# Patient Record
Sex: Female | Born: 1980 | Race: Black or African American | Hispanic: No | Marital: Married | Smoking: Never smoker
Health system: Southern US, Community
[De-identification: ages and names within clinical notes are randomized; demographics above are authoritative.]

## PROBLEM LIST (undated history)

## (undated) DIAGNOSIS — D219 Benign neoplasm of connective and other soft tissue, unspecified: Secondary | ICD-10-CM

## (undated) HISTORY — DX: Benign neoplasm of connective and other soft tissue, unspecified: D21.9

## (undated) HISTORY — PX: NO PAST SURGERIES: SHX2092

---

## 2018-05-21 ENCOUNTER — Emergency Department (HOSPITAL_COMMUNITY)
Admission: EM | Admit: 2018-05-21 | Discharge: 2018-05-21 | Disposition: A | Discharge status: Home / Self Care | Payer: Self-pay | Attending: Emergency Medicine | Admitting: Emergency Medicine

## 2018-05-21 ENCOUNTER — Encounter (HOSPITAL_COMMUNITY): Payer: Self-pay

## 2018-05-21 DIAGNOSIS — S39012A Strain of muscle, fascia and tendon of lower back, initial encounter: Secondary | ICD-10-CM

## 2018-05-21 DIAGNOSIS — Y92410 Unspecified street and highway as the place of occurrence of the external cause: Secondary | ICD-10-CM | POA: Insufficient documentation

## 2018-05-21 DIAGNOSIS — Y939 Activity, unspecified: Secondary | ICD-10-CM | POA: Insufficient documentation

## 2018-05-21 DIAGNOSIS — Y998 Other external cause status: Secondary | ICD-10-CM | POA: Insufficient documentation

## 2018-05-21 MED ORDER — NAPROXEN 500 MG PO TABS: 500.0000 mg | ORAL_TABLET | Freq: Two times a day (BID) | ORAL | 0 refills | Status: DC | PRN

## 2018-05-21 MED ORDER — METHOCARBAMOL 500 MG PO TABS: 500.0000 mg | ORAL_TABLET | Freq: Two times a day (BID) | ORAL | 0 refills | Status: DC | PRN

## 2018-05-21 NOTE — ED Notes (Signed)
ED Provider at bedside. 

## 2018-05-21 NOTE — ED Notes (Signed)
Patient verbalizes understanding of discharge instructions. Opportunity for questioning and answers were provided. Armband removed by staff, pt discharged from ED. Pt ambulatory to lobby.  
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
## 2018-05-21 NOTE — Discharge Instructions (Signed)
Alternate ice and heat to areas of injury 3-4 times per day to limit inflammation and spasm.  Avoid strenuous activity and heavy lifting.  We recommend consistent use of naproxen in addition to Robaxin for muscle spasms. We recommend follow-up with a primary care doctor to ensure resolution of symptoms.  Return to the ED for any new or concerning symptoms. 
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
## 2019-12-20 ENCOUNTER — Other Ambulatory Visit: Payer: Self-pay | Admitting: Obstetrics & Gynecology

## 2019-12-20 DIAGNOSIS — D259 Leiomyoma of uterus, unspecified: Secondary | ICD-10-CM

## 2019-12-20 DIAGNOSIS — Z3A15 15 weeks gestation of pregnancy: Secondary | ICD-10-CM

## 2019-12-20 DIAGNOSIS — O4402 Placenta previa specified as without hemorrhage, second trimester: Secondary | ICD-10-CM

## 2019-12-20 DIAGNOSIS — O26879 Cervical shortening, unspecified trimester: Secondary | ICD-10-CM

## 2019-12-25 ENCOUNTER — Encounter: Payer: Self-pay | Admitting: *Deleted

## 2019-12-26 ENCOUNTER — Other Ambulatory Visit: Payer: Self-pay | Admitting: Obstetrics & Gynecology

## 2019-12-26 ENCOUNTER — Ambulatory Visit: Payer: 59

## 2019-12-26 ENCOUNTER — Other Ambulatory Visit: Payer: Self-pay

## 2019-12-26 ENCOUNTER — Ambulatory Visit: Payer: 59 | Admitting: *Deleted

## 2019-12-26 ENCOUNTER — Ambulatory Visit: Payer: 59 | Attending: Obstetrics & Gynecology

## 2019-12-26 VITALS — BP 116/77 | HR 82

## 2019-12-26 DIAGNOSIS — O09522 Supervision of elderly multigravida, second trimester: Secondary | ICD-10-CM | POA: Diagnosis not present

## 2019-12-26 DIAGNOSIS — O26879 Cervical shortening, unspecified trimester: Secondary | ICD-10-CM

## 2019-12-26 DIAGNOSIS — O09519 Supervision of elderly primigravida, unspecified trimester: Secondary | ICD-10-CM

## 2019-12-26 DIAGNOSIS — Z3A15 15 weeks gestation of pregnancy: Secondary | ICD-10-CM

## 2019-12-26 DIAGNOSIS — D259 Leiomyoma of uterus, unspecified: Secondary | ICD-10-CM

## 2019-12-26 DIAGNOSIS — O3412 Maternal care for benign tumor of corpus uteri, second trimester: Secondary | ICD-10-CM

## 2019-12-26 DIAGNOSIS — O4402 Placenta previa specified as without hemorrhage, second trimester: Secondary | ICD-10-CM | POA: Insufficient documentation

## 2019-12-26 DIAGNOSIS — O359XX0 Maternal care for (suspected) fetal abnormality and damage, unspecified, not applicable or unspecified: Secondary | ICD-10-CM | POA: Diagnosis not present

## 2019-12-26 DIAGNOSIS — O09512 Supervision of elderly primigravida, second trimester: Secondary | ICD-10-CM | POA: Insufficient documentation

## 2019-12-26 DIAGNOSIS — O26872 Cervical shortening, second trimester: Secondary | ICD-10-CM | POA: Diagnosis present

## 2019-12-27 ENCOUNTER — Other Ambulatory Visit: Payer: Self-pay | Admitting: *Deleted

## 2019-12-27 DIAGNOSIS — O26879 Cervical shortening, unspecified trimester: Secondary | ICD-10-CM

## 2020-01-09 ENCOUNTER — Ambulatory Visit: Payer: 59

## 2020-01-09 ENCOUNTER — Other Ambulatory Visit: Payer: Self-pay

## 2020-01-10 ENCOUNTER — Ambulatory Visit: Payer: 59 | Admitting: *Deleted

## 2020-01-10 ENCOUNTER — Ambulatory Visit: Payer: 59 | Attending: Maternal & Fetal Medicine

## 2020-01-10 VITALS — BP 117/79 | HR 77

## 2020-01-10 DIAGNOSIS — O09519 Supervision of elderly primigravida, unspecified trimester: Secondary | ICD-10-CM | POA: Insufficient documentation

## 2020-01-10 DIAGNOSIS — O3432 Maternal care for cervical incompetence, second trimester: Secondary | ICD-10-CM

## 2020-01-10 DIAGNOSIS — O3412 Maternal care for benign tumor of corpus uteri, second trimester: Secondary | ICD-10-CM

## 2020-01-10 DIAGNOSIS — O26872 Cervical shortening, second trimester: Secondary | ICD-10-CM | POA: Diagnosis not present

## 2020-01-10 DIAGNOSIS — O26879 Cervical shortening, unspecified trimester: Secondary | ICD-10-CM | POA: Diagnosis present

## 2020-01-10 DIAGNOSIS — O4422 Partial placenta previa NOS or without hemorrhage, second trimester: Secondary | ICD-10-CM | POA: Diagnosis not present

## 2020-01-10 DIAGNOSIS — D259 Leiomyoma of uterus, unspecified: Secondary | ICD-10-CM

## 2020-01-10 DIAGNOSIS — O09522 Supervision of elderly multigravida, second trimester: Secondary | ICD-10-CM

## 2020-01-10 DIAGNOSIS — Z3686 Encounter for antenatal screening for cervical length: Secondary | ICD-10-CM

## 2020-01-10 DIAGNOSIS — O321XX0 Maternal care for breech presentation, not applicable or unspecified: Secondary | ICD-10-CM

## 2020-01-10 DIAGNOSIS — Z3A17 17 weeks gestation of pregnancy: Secondary | ICD-10-CM

## 2020-01-11 ENCOUNTER — Other Ambulatory Visit: Payer: Self-pay | Admitting: *Deleted

## 2020-01-11 DIAGNOSIS — O26879 Cervical shortening, unspecified trimester: Secondary | ICD-10-CM

## 2020-01-17 ENCOUNTER — Other Ambulatory Visit: Payer: Self-pay | Admitting: Obstetrics and Gynecology

## 2020-01-17 ENCOUNTER — Other Ambulatory Visit: Payer: Self-pay | Admitting: Obstetrics & Gynecology

## 2020-01-24 ENCOUNTER — Ambulatory Visit: Payer: 59 | Attending: Obstetrics and Gynecology

## 2020-01-24 ENCOUNTER — Ambulatory Visit: Payer: 59 | Admitting: *Deleted

## 2020-01-24 ENCOUNTER — Other Ambulatory Visit: Payer: Self-pay

## 2020-01-24 ENCOUNTER — Other Ambulatory Visit: Payer: Self-pay | Admitting: *Deleted

## 2020-01-24 VITALS — BP 143/67 | HR 83

## 2020-01-24 DIAGNOSIS — O09519 Supervision of elderly primigravida, unspecified trimester: Secondary | ICD-10-CM | POA: Diagnosis present

## 2020-01-24 DIAGNOSIS — Z362 Encounter for other antenatal screening follow-up: Secondary | ICD-10-CM

## 2020-01-24 DIAGNOSIS — Z3686 Encounter for antenatal screening for cervical length: Secondary | ICD-10-CM

## 2020-01-24 DIAGNOSIS — O26879 Cervical shortening, unspecified trimester: Secondary | ICD-10-CM | POA: Diagnosis not present

## 2020-01-24 DIAGNOSIS — Z3A19 19 weeks gestation of pregnancy: Secondary | ICD-10-CM

## 2020-01-24 DIAGNOSIS — O4402 Placenta previa specified as without hemorrhage, second trimester: Secondary | ICD-10-CM | POA: Diagnosis not present

## 2020-01-24 DIAGNOSIS — O3412 Maternal care for benign tumor of corpus uteri, second trimester: Secondary | ICD-10-CM

## 2020-01-24 DIAGNOSIS — O359XX0 Maternal care for (suspected) fetal abnormality and damage, unspecified, not applicable or unspecified: Secondary | ICD-10-CM

## 2020-01-24 DIAGNOSIS — O43129 Velamentous insertion of umbilical cord, unspecified trimester: Secondary | ICD-10-CM

## 2020-01-24 DIAGNOSIS — O09522 Supervision of elderly multigravida, second trimester: Secondary | ICD-10-CM | POA: Diagnosis not present

## 2020-01-24 DIAGNOSIS — D259 Leiomyoma of uterus, unspecified: Secondary | ICD-10-CM

## 2020-01-30 ENCOUNTER — Ambulatory Visit: Payer: 59

## 2020-01-30 ENCOUNTER — Ambulatory Visit: Payer: 59 | Attending: Maternal & Fetal Medicine

## 2020-01-30 ENCOUNTER — Other Ambulatory Visit: Payer: Self-pay

## 2020-01-30 ENCOUNTER — Other Ambulatory Visit: Payer: Self-pay | Admitting: *Deleted

## 2020-01-30 ENCOUNTER — Other Ambulatory Visit: Payer: Self-pay | Admitting: Maternal & Fetal Medicine

## 2020-01-30 DIAGNOSIS — O359XX0 Maternal care for (suspected) fetal abnormality and damage, unspecified, not applicable or unspecified: Secondary | ICD-10-CM

## 2020-01-30 DIAGNOSIS — O26879 Cervical shortening, unspecified trimester: Secondary | ICD-10-CM

## 2020-01-30 DIAGNOSIS — O4402 Placenta previa specified as without hemorrhage, second trimester: Secondary | ICD-10-CM | POA: Diagnosis not present

## 2020-01-30 DIAGNOSIS — Z3A20 20 weeks gestation of pregnancy: Secondary | ICD-10-CM

## 2020-01-30 DIAGNOSIS — O3412 Maternal care for benign tumor of corpus uteri, second trimester: Secondary | ICD-10-CM

## 2020-01-30 DIAGNOSIS — D259 Leiomyoma of uterus, unspecified: Secondary | ICD-10-CM

## 2020-01-30 DIAGNOSIS — O09522 Supervision of elderly multigravida, second trimester: Secondary | ICD-10-CM | POA: Diagnosis not present

## 2020-01-30 DIAGNOSIS — O43122 Velamentous insertion of umbilical cord, second trimester: Secondary | ICD-10-CM

## 2020-01-30 DIAGNOSIS — Z3686 Encounter for antenatal screening for cervical length: Secondary | ICD-10-CM

## 2020-02-07 ENCOUNTER — Other Ambulatory Visit: Payer: Self-pay

## 2020-02-07 ENCOUNTER — Ambulatory Visit: Payer: 59

## 2020-02-07 ENCOUNTER — Inpatient Hospital Stay (HOSPITAL_COMMUNITY)
Admission: AD | Admit: 2020-02-07 | Discharge: 2020-02-07 | Disposition: A | Discharge status: Home / Self Care | Payer: 59 | Attending: Obstetrics and Gynecology | Admitting: Obstetrics and Gynecology

## 2020-02-07 ENCOUNTER — Other Ambulatory Visit: Payer: 59

## 2020-02-07 ENCOUNTER — Encounter (HOSPITAL_COMMUNITY): Payer: Self-pay | Admitting: Obstetrics and Gynecology

## 2020-02-07 DIAGNOSIS — R82998 Other abnormal findings in urine: Secondary | ICD-10-CM | POA: Diagnosis not present

## 2020-02-07 DIAGNOSIS — O4402 Placenta previa specified as without hemorrhage, second trimester: Secondary | ICD-10-CM | POA: Insufficient documentation

## 2020-02-07 DIAGNOSIS — R1032 Left lower quadrant pain: Secondary | ICD-10-CM | POA: Diagnosis present

## 2020-02-07 DIAGNOSIS — O26892 Other specified pregnancy related conditions, second trimester: Secondary | ICD-10-CM | POA: Diagnosis not present

## 2020-02-07 DIAGNOSIS — O99891 Other specified diseases and conditions complicating pregnancy: Secondary | ICD-10-CM | POA: Diagnosis not present

## 2020-02-07 DIAGNOSIS — O26879 Cervical shortening, unspecified trimester: Secondary | ICD-10-CM | POA: Diagnosis present

## 2020-02-07 DIAGNOSIS — O43129 Velamentous insertion of umbilical cord, unspecified trimester: Secondary | ICD-10-CM | POA: Diagnosis present

## 2020-02-07 DIAGNOSIS — O4400 Placenta previa specified as without hemorrhage, unspecified trimester: Secondary | ICD-10-CM | POA: Diagnosis present

## 2020-02-07 DIAGNOSIS — O09512 Supervision of elderly primigravida, second trimester: Secondary | ICD-10-CM | POA: Insufficient documentation

## 2020-02-07 DIAGNOSIS — Z3A21 21 weeks gestation of pregnancy: Secondary | ICD-10-CM | POA: Insufficient documentation

## 2020-02-07 DIAGNOSIS — O26872 Cervical shortening, second trimester: Secondary | ICD-10-CM

## 2020-02-07 LAB — URINALYSIS, ROUTINE W REFLEX MICROSCOPIC
Bilirubin Urine: NEGATIVE
Glucose, UA: NEGATIVE mg/dL
Hgb urine dipstick: NEGATIVE
Ketones, ur: NEGATIVE mg/dL
Nitrite: NEGATIVE
Protein, ur: NEGATIVE mg/dL
Specific Gravity, Urine: 1.025 (ref 1.005–1.030)
pH: 5 (ref 5.0–8.0)

## 2020-02-07 MED ORDER — CEPHALEXIN 500 MG PO CAPS: 500.0000 mg | ORAL_CAPSULE | Freq: Four times a day (QID) | ORAL | 2 refills | Status: DC

## 2020-02-07 MED ORDER — IBUPROFEN 600 MG PO TABS
600.0000 mg | ORAL_TABLET | Freq: Once | ORAL | Status: AC
  Administered 2020-02-07: 600 mg via ORAL
  Filled 2020-02-07: qty 1

## 2020-02-07 NOTE — MAU Note (Addendum)
I have had pain in my abdomen since Tues. Was alittle better yesterday. THis morning I was crying with pain. Esp hurt to turn in bed and get up. Pain worse in LLQ and is sharp. Last BM Weds and was normal. Denies VB or LOF. I called the office yesterday and someone took my information but never received a call back.

## 2020-02-07 NOTE — MAU Provider Note (Signed)
Chief Complaint:  Abdominal Pain   First Provider Initiated Contact with Patient 02/07/20 0531     HPI: Tiffany Hoffman is a 39 y.o. G1P0 at 38w5dwho presents to maternity admissions reporting pain in the left lower quadrant since Tuesday. Tried calling the office but no one called her back so she came in. States has had normal bowel movements and denies dysuria.  Hurts more with turning over in bed or getting up.  . She reports good fetal movement, denies LOF, vaginal bleeding, vaginal itching/burning, urinary symptoms, h/a, dizziness, n/v, diarrhea, constipation or fever/chills.  She denies headache, visual changes or RUQ abdominal pain.  Abdominal Pain This is a new problem. The current episode started in the past 7 days. The problem occurs intermittently. The problem has been unchanged. The pain is located in the LLQ. The pain is moderate. The quality of the pain is cramping and sharp. The abdominal pain does not radiate. Pertinent negatives include no anorexia, constipation, diarrhea, dysuria, fever, frequency, headaches, myalgias, nausea or vomiting. The pain is aggravated by certain positions and movement. The pain is relieved by nothing. She has tried nothing for the symptoms.    RN Note: have had pain in my abdomen since Tues. Was alittle better yesterday. THis morning I was crying with pain. Esp hurt to turn in bed and get up. Pain worse in LLQ and is sharp. Last BM Weds and was normal. Denies VB or LOF. I called the office yesterday and someone took my information but never received a call back.  Past Medical History: Past Medical History:  Diagnosis Date  . Fibroid     Past obstetric history: OB History  Gravida Para Term Preterm AB Living  1         0  SAB TAB Ectopic Multiple Live Births               # Outcome Date GA Lbr Len/2nd Weight Sex Delivery Anes PTL Lv  1 Current             Past Surgical History: Past Surgical History:  Procedure Laterality Date  . NO PAST  SURGERIES      Family History: Family History  Problem Relation Age of Onset  . Heart disease Mother   . Hypertension Father     Social History: Social History   Tobacco Use  . Smoking status: Never Smoker  . Smokeless tobacco: Never Used  Vaping Use  . Vaping Use: Never used  Substance Use Topics  . Alcohol use: Never  . Drug use: Not Currently    Allergies:  Allergies  Allergen Reactions  . Latex Itching    Meds:  Medications Prior to Admission  Medication Sig Dispense Refill Last Dose  . ondansetron (ZOFRAN-ODT) 4 MG disintegrating tablet Take 4 mg by mouth every 8 (eight) hours as needed for nausea or vomiting.   Past Week at Unknown time  . Prenatal Vit-Fe Fumarate-FA (PRENATAL VITAMINS PO) Take by mouth.   02/06/2020 at Unknown time  . progesterone 200 MG SUPP Place 200 mg vaginally at bedtime.   02/06/2020 at Unknown time  . Doxylamine Succinate, Sleep, (UNISOM PO) Take by mouth. (Patient not taking: Reported on 12/26/2019)     . Pyridoxine HCl (VITAMIN B-6 PO) Take by mouth. (Patient not taking: Reported on 12/26/2019)       I have reviewed patient's Past Medical Hx, Surgical Hx, Family Hx, Social Hx, medications and allergies.   ROS:  Review of Systems  Constitutional:  Negative for fever.  Gastrointestinal: Positive for abdominal pain. Negative for anorexia, constipation, diarrhea, nausea and vomiting.  Genitourinary: Negative for dysuria and frequency.  Musculoskeletal: Negative for myalgias.  Neurological: Negative for headaches.   Other systems negative  Physical Exam   Patient Vitals for the past 24 hrs:  BP Temp Pulse Resp Height Weight  02/07/20 0510 121/76 98.4 F (36.9 C) 79 20 5' 5.5" (1.664 m) 88.9 kg   Constitutional: Well-developed, well-nourished female in no acute distress.  Cardiovascular: normal rate and rhythm Respiratory: normal effort, clear to auscultation bilaterally GI: Abd soft, tender over LLQ, abdomen gravid appropriate for  gestational age.   No rebound or guarding. MS: Extremities nontender, no edema, normal ROM Neurologic: Alert and oriented x 4.  GU: Neg CVAT.  PELVIC EXAM: Gentle, careful exam of her cervix, being aware of her placenta previa.  Cervix is closed and about 50% effaced which is consistent with her [place] measurements of 2cm.    FHT:   152   No irritability seen on Toco   Labs: Results for orders placed or performed during the hospital encounter of 02/07/20 (from the past 24 hour(s))  Urinalysis, Routine w reflex microscopic Urine, Clean Catch     Status: Abnormal   Collection Time: 02/07/20  5:50 AM  Result Value Ref Range   Color, Urine YELLOW YELLOW   APPearance CLOUDY (A) CLEAR   Specific Gravity, Urine 1.025 1.005 - 1.030   pH 5.0 5.0 - 8.0   Glucose, UA NEGATIVE NEGATIVE mg/dL   Hgb urine dipstick NEGATIVE NEGATIVE   Bilirubin Urine NEGATIVE NEGATIVE   Ketones, ur NEGATIVE NEGATIVE mg/dL   Protein, ur NEGATIVE NEGATIVE mg/dL   Nitrite NEGATIVE NEGATIVE   Leukocytes,Ua LARGE (A) NEGATIVE   RBC / HPF 0-5 0 - 5 RBC/hpf   WBC, UA 0-5 0 - 5 WBC/hpf   Bacteria, UA RARE (A) NONE SEEN   Squamous Epithelial / LPF 6-10 0 - 5   Mucus PRESENT      Imaging:    MAU Course/MDM: I have ordered labs and reviewed results. Urine is suggestive of possible infection, given leukocytes.  Will send to culture and start her on Keflex for possible UTI We gave her a dose of ibuprofen 600mg  to see if pain is relieved (which would suggest uterine irritability), but she got little relief of pain with this.  Discussed the cervix being unchanged is reassuring that this is not labor.  WIth leukocytes, it may reflect an infection.  It is also possible that this is round ligament pain    Assessment: Single IUP at [redacted]w[redacted]d Left lower quadrant pain Leukocytes in urine Possible round ligament pain vs UTI  Plan: Discharge home Urine culture Rx Keflex for presumed UTI Message left at office to check in with  her before weekend Preterm Labor precautions and fetal kick counts Pelvic rest Follow up in Office for prenatal visits  Has MFM appt next week (serial [place] measurements)  Pt stable at time of discharge.  Hansel Feinstein CNM, MSN Certified Nurse-Midwife 02/07/2020 5:31 AM

## 2020-02-07 NOTE — Progress Notes (Signed)
Written and verbal d/c instructions given and understanding voiced. PT also given sheet of 'Safe Meds in [place]'

## 2020-02-07 NOTE — Discharge Instructions (Signed)
Pregnancy and Urinary Tract Infection ° °A urinary tract infection (UTI) is an infection of any part of the urinary tract. This includes the kidneys, the tubes that connect your kidneys to your bladder (ureters), the bladder, and the tube that carries urine out of your body (urethra). These organs make, store, and get rid of urine in the body. Your health care provider may use other names to describe the infection. An upper UTI affects the ureters and kidneys (pyelonephritis). A lower UTI affects the bladder (cystitis) and urethra (urethritis). °Most urinary tract infections are caused by bacteria in your genital area, around the entrance to your urinary tract (urethra). These bacteria grow and cause irritation and inflammation of your urinary tract. You are more likely to develop a UTI during pregnancy because the physical and hormonal changes your body goes through can make it easier for bacteria to get into your urinary tract. Your growing baby also puts pressure on your bladder and can affect urine flow. It is important to recognize and treat UTIs in pregnancy because of the risk of serious complications for both you and your baby. °How does this affect me? °Symptoms of a UTI include: °· Needing to urinate right away (urgently). °· Frequent urination or passing small amounts of urine frequently. °· Pain or burning with urination. °· Blood in the urine. °· Urine that smells bad or unusual. °· Trouble urinating. °· Cloudy urine. °· Pain in the abdomen or lower back. °· Vaginal discharge. °You may also have: °· Vomiting or a decreased appetite. °· Confusion. °· Irritability or tiredness. °· A fever. °· Diarrhea. °How does this affect my baby? °An untreated UTI during pregnancy could lead to a kidney infection or a systemic infection, which can cause health problems that could affect your baby. Possible complications of an untreated UTI include: °· Giving birth to your baby before 37 weeks of pregnancy  (premature). °· Having a baby with a low birth weight. °· Developing high blood pressure during pregnancy (preeclampsia). °· Having a low hemoglobin level (anemia). °What can I do to lower my risk? °To prevent a UTI: °· Go to the bathroom as soon as you feel the need. Do not hold urine for long periods of time. °· Always wipe from front to back, especially after a bowel movement. Use each tissue one time when you wipe. °· Empty your bladder after sex. °· Keep your genital area dry. °· Drink 6-10 glasses of water each day. °· Do not douche or use deodorant sprays. °How is this treated? °Treatment for this condition may include: °· Antibiotic medicines that are safe to take during pregnancy. °· Other medicines to treat less common causes of UTI. °Follow these instructions at home: °· If you were prescribed an antibiotic medicine, take it as told by your health care provider. Do not stop using the antibiotic even if you start to feel better. °· Keep all follow-up visits as told by your health care provider. This is important. °Contact a health care provider if: °· Your symptoms do not improve or they get worse. °· You have abnormal vaginal discharge. °Get help right away if you: °· Have a fever. °· Have nausea and vomiting. °· Have back or side pain. °· Feel contractions in your uterus. °· Have lower belly pain. °· Have a gush of fluid from your vagina. °· Have blood in your urine. °Summary °· A urinary tract infection (UTI) is an infection of any part of the urinary tract, which includes the   kidneys, ureters, bladder, and urethra.  Most urinary tract infections are caused by bacteria in your genital area, around the entrance to your urinary tract (urethra).  You are more likely to develop a UTI during pregnancy.  If you were prescribed an antibiotic medicine, take it as told by your health care provider. Do not stop using the antibiotic even if you start to feel better. This information is not intended to  replace advice given to you by your health care provider. Make sure you discuss any questions you have with your health care provider. Document Revised: 08/11/2018 Document Reviewed: 03/23/2018 Elsevier Patient Education  [place]. Abdominal Pain During Pregnancy  Abdominal pain is common during pregnancy, and has many possible causes. Some causes are more serious than others, and sometimes the cause is not known. Abdominal pain can be a sign that labor is starting. It can also be caused by normal growth and stretching of muscles and ligaments during pregnancy. Always tell your health care provider if you have any abdominal pain. Follow these instructions at home:  Do not have sex or put anything in your vagina until your pain goes away completely.  Get plenty of rest until your pain improves.  Drink enough fluid to keep your urine pale yellow.  Take over-the-counter and prescription medicines only as told by your health care provider.  Keep all follow-up visits as told by your health care provider. This is important. Contact a health care provider if:  Your pain continues or gets worse after resting.  You have lower abdominal pain that: ? Comes and goes at regular intervals. ? Spreads to your back. ? Is similar to menstrual cramps.  You have pain or burning when you urinate. Get help right away if:  You have a fever or chills.  You have vaginal bleeding.  You are leaking fluid from your vagina.  You are passing tissue from your vagina.  You have vomiting or diarrhea that lasts for more than 24 hours.  Your baby is moving less than usual.  You feel very weak or faint.  You have shortness of breath.  You develop severe pain in your upper abdomen. Summary  Abdominal pain is common during pregnancy, and has many possible causes.  If you experience abdominal pain during pregnancy, tell your health care provider right away.  Follow your health care provider's  home care instructions and keep all follow-up visits as directed. This information is not intended to replace advice given to you by your health care provider. Make sure you discuss any questions you have with your health care provider. Document Revised: 08/07/2018 Document Reviewed: 07/22/2016 Elsevier Patient Education  [place].

## 2020-02-08 LAB — CULTURE, OB URINE

## 2020-02-11 ENCOUNTER — Inpatient Hospital Stay (HOSPITAL_COMMUNITY)
Admission: AD | Admit: 2020-02-11 | Discharge: 2020-02-11 | Disposition: A | Discharge status: Home / Self Care | Payer: 59 | Attending: Obstetrics and Gynecology | Admitting: Obstetrics and Gynecology

## 2020-02-11 ENCOUNTER — Inpatient Hospital Stay (HOSPITAL_BASED_OUTPATIENT_CLINIC_OR_DEPARTMENT_OTHER): Payer: 59

## 2020-02-11 ENCOUNTER — Other Ambulatory Visit: Payer: Self-pay

## 2020-02-11 ENCOUNTER — Encounter (HOSPITAL_COMMUNITY): Payer: Self-pay | Admitting: Obstetrics and Gynecology

## 2020-02-11 ENCOUNTER — Inpatient Hospital Stay (HOSPITAL_COMMUNITY): Payer: 59

## 2020-02-11 DIAGNOSIS — O4412 Placenta previa with hemorrhage, second trimester: Secondary | ICD-10-CM | POA: Diagnosis not present

## 2020-02-11 DIAGNOSIS — O09522 Supervision of elderly multigravida, second trimester: Secondary | ICD-10-CM

## 2020-02-11 DIAGNOSIS — O26872 Cervical shortening, second trimester: Secondary | ICD-10-CM | POA: Diagnosis not present

## 2020-02-11 DIAGNOSIS — Z3A22 22 weeks gestation of pregnancy: Secondary | ICD-10-CM | POA: Insufficient documentation

## 2020-02-11 DIAGNOSIS — O26892 Other specified pregnancy related conditions, second trimester: Secondary | ICD-10-CM

## 2020-02-11 DIAGNOSIS — O359XX0 Maternal care for (suspected) fetal abnormality and damage, unspecified, not applicable or unspecified: Secondary | ICD-10-CM

## 2020-02-11 DIAGNOSIS — O43122 Velamentous insertion of umbilical cord, second trimester: Secondary | ICD-10-CM

## 2020-02-11 DIAGNOSIS — O3412 Maternal care for benign tumor of corpus uteri, second trimester: Secondary | ICD-10-CM | POA: Diagnosis not present

## 2020-02-11 DIAGNOSIS — O4402 Placenta previa specified as without hemorrhage, second trimester: Secondary | ICD-10-CM

## 2020-02-11 DIAGNOSIS — R102 Pelvic and perineal pain: Secondary | ICD-10-CM | POA: Diagnosis not present

## 2020-02-11 DIAGNOSIS — D259 Leiomyoma of uterus, unspecified: Secondary | ICD-10-CM

## 2020-02-11 LAB — CBC
HCT: 40 % (ref 36.0–46.0)
Hemoglobin: 13.1 g/dL (ref 12.0–15.0)
MCH: 28.2 pg (ref 26.0–34.0)
MCHC: 32.8 g/dL (ref 30.0–36.0)
MCV: 86 fL (ref 80.0–100.0)
Platelets: 217 10*3/uL (ref 150–400)
RBC: 4.65 MIL/uL (ref 3.87–5.11)
RDW: 19.9 % — ABNORMAL HIGH (ref 11.5–15.5)
WBC: 10.5 10*3/uL (ref 4.0–10.5)
nRBC: 0 % (ref 0.0–0.2)

## 2020-02-11 LAB — URINALYSIS, MICROSCOPIC (REFLEX)

## 2020-02-11 LAB — COMPREHENSIVE METABOLIC PANEL
ALT: 15 U/L (ref 0–44)
AST: 17 U/L (ref 15–41)
Albumin: 3.4 g/dL — ABNORMAL LOW (ref 3.5–5.0)
Alkaline Phosphatase: 48 U/L (ref 38–126)
Anion gap: 12 (ref 5–15)
BUN: 12 mg/dL (ref 6–20)
CO2: 20 mmol/L — ABNORMAL LOW (ref 22–32)
Calcium: 9.7 mg/dL (ref 8.9–10.3)
Chloride: 103 mmol/L (ref 98–111)
Creatinine, Ser: 0.66 mg/dL (ref 0.44–1.00)
GFR, Estimated: >60 mL/min (ref >60)
Glucose, Bld: 102 mg/dL — ABNORMAL HIGH (ref 70–99)
Potassium: 3.8 mmol/L (ref 3.5–5.1)
Sodium: 135 mmol/L (ref 135–145)
Total Bilirubin: 0.5 mg/dL (ref 0.3–1.2)
Total Protein: 7.3 g/dL (ref 6.5–8.1)

## 2020-02-11 LAB — URINALYSIS, ROUTINE W REFLEX MICROSCOPIC
Bilirubin Urine: NEGATIVE
Glucose, UA: NEGATIVE mg/dL
Ketones, ur: NEGATIVE mg/dL
Nitrite: NEGATIVE
Protein, ur: NEGATIVE mg/dL
Specific Gravity, Urine: 1.025 (ref 1.005–1.030)
pH: 5.5 (ref 5.0–8.0)

## 2020-02-11 MED ORDER — LACTATED RINGERS IV SOLN: INTRAVENOUS | Status: DC

## 2020-02-11 MED ORDER — HYDROMORPHONE HCL 1 MG/ML IJ SOLN
1.0000 mg | Freq: Once | INTRAMUSCULAR | Status: AC
  Administered 2020-02-11: 1 mg via INTRAVENOUS
  Filled 2020-02-11: qty 1

## 2020-02-11 MED ORDER — LORAZEPAM 2 MG/ML IJ SOLN
0.5000 mg | Freq: Once | INTRAMUSCULAR | Status: AC
  Administered 2020-02-11: 0.5 mg via INTRAVENOUS
  Filled 2020-02-11: qty 1

## 2020-02-11 MED ORDER — HYDROMORPHONE HCL 1 MG/ML IJ SOLN
0.5000 mg | Freq: Once | INTRAMUSCULAR | Status: AC
  Administered 2020-02-11: 0.5 mg via INTRAVENOUS
  Filled 2020-02-11: qty 1

## 2020-02-11 MED ORDER — OXYCODONE-ACETAMINOPHEN 5-325 MG PO TABS: 2.0000 | ORAL_TABLET | Freq: Four times a day (QID) | ORAL | 0 refills | Status: DC | PRN

## 2020-02-11 NOTE — MAU Note (Signed)
Patient was seen in MAU last week for abdominal pain on the right side and was diagnosed with a UTI.  Was prescribed medications, but hasn't gotten them filled yet.  Now pain in on the left side since last night.  Pain is constant, but worsens intermittently.  "It hurts so bad I can't even explain it." Denies vaginal bleeding or LOF. Having more discharge. No fever.  No n/v/d.

## 2020-02-11 NOTE — Discharge Instructions (Signed)
It appears that you have a degenerating fibroid I have prescribed Percocet - you can take 1-2 tablets every 6 hours as you need to You can also use heat in that area, if it helps with the pain. Only apply heat for 20 minutes at a time, 4-5 times a day Return if pain is increasing, if you become nauseated and are vomiting or if you develop fevers or shaking chills. Your doctor's office will call you to arrange a follow up appointment.

## 2020-02-11 NOTE — MAU Provider Note (Signed)
History     CSN: [id number]  Arrival date and time: 02/11/20 1093     First Provider Initiated Contact with Patient 02/11/20 1002      Chief Complaint  Patient presents with  . Abdominal Pain   HPI This is a 39 year old G1P0 at [redacted]w[redacted]d.  Pregnancy complicated by cervix, posterior previa, velamentous cord insertion, AMA.  Patient presents with abdominal pain that started approximately 10 days ago.  Pain was initially in the left lower quadrant.  She was evaluated 4 days ago, diagnosed with UTI and sent home.  Never picked up the prescription.  Pain is now worsened and on the right lower quadrant.  No radiation to the pain, although entire abdomen is extremely tender.  Movement increases pain, as does laying flat.  She denies vaginal bleeding, hematuria, constipation, diarrhea, nausea, vomiting, fever.  OB History    Gravida  1   Para      Term      Preterm      AB      Living  0     SAB      TAB      Ectopic      Multiple      Live Births              Past Medical History:  Diagnosis Date  . Fibroid     Past Surgical History:  Procedure Laterality Date  . NO PAST SURGERIES      Family History  Problem Relation Age of Onset  . Heart disease Mother   . Hypertension Father     Social History   Tobacco Use  . Smoking status: Never Smoker  . Smokeless tobacco: Never Used  Vaping Use  . Vaping Use: Never used  Substance Use Topics  . Alcohol use: Never  . Drug use: Not Currently    Allergies:  Allergies  Allergen Reactions  . Latex Itching    Medications Prior to Admission  Medication Sig Dispense Refill Last Dose  . ondansetron (ZOFRAN-ODT) 4 MG disintegrating tablet Take 4 mg by mouth every 8 (eight) hours as needed for nausea or vomiting.   Past Week at Unknown time  . Prenatal Vit-Fe Fumarate-FA (PRENATAL VITAMINS PO) Take by mouth.   02/10/2020 at Unknown time  . progesterone 200 MG SUPP Place 200 mg vaginally at bedtime.   02/10/2020  at Unknown time  . cephALEXin (KEFLEX) 500 MG capsule Take 1 capsule (500 mg total) by mouth 4 (four) times daily. 28 capsule 2   . Doxylamine Succinate, Sleep, (UNISOM PO) Take by mouth. (Patient not taking: Reported on 12/26/2019)     . Pyridoxine HCl (VITAMIN B-6 PO) Take by mouth. (Patient not taking: Reported on 12/26/2019)       Review of Systems Physical Exam   Blood pressure 132/69, pulse 86, temperature 98.1 F (36.7 C), temperature source Oral, resp. rate 20, last menstrual period 09/08/2019.  Physical Exam Vitals reviewed.  Constitutional:      Appearance: She is well-developed.  HENT:     Head: Normocephalic and atraumatic.  Cardiovascular:     Rate and Rhythm: Normal rate and regular rhythm.     Heart sounds: Normal heart sounds.  Pulmonary:     Effort: Pulmonary effort is normal.  Abdominal:     General: Abdomen is flat.     Tenderness: There is abdominal tenderness in the right upper quadrant, right lower quadrant, left upper quadrant and left lower quadrant. There is guarding  and rebound. There is no right CVA tenderness or left CVA tenderness. Negative signs include Murphy's sign.  Skin:    General: Skin is warm and dry.     Capillary Refill: Capillary refill takes less than 2 seconds.  Neurological:     General: No focal deficit present.     Mental Status: She is alert and oriented to person, place, and time.  Psychiatric:        Mood and Affect: Mood normal.        Behavior: Behavior normal.    Results for orders placed or performed during the hospital encounter of 02/11/20 (from the past 24 hour(s))  Urinalysis, Routine w reflex microscopic Urine, Clean Catch     Status: Abnormal   Collection Time: 02/11/20  9:57 AM  Result Value Ref Range   Color, Urine YELLOW YELLOW   APPearance CLOUDY (A) CLEAR   Specific Gravity, Urine 1.025 1.005 - 1.030   pH 5.5 5.0 - 8.0   Glucose, UA NEGATIVE NEGATIVE mg/dL   Hgb urine dipstick SMALL (A) NEGATIVE   Bilirubin  Urine NEGATIVE NEGATIVE   Ketones, ur NEGATIVE NEGATIVE mg/dL   Protein, ur NEGATIVE NEGATIVE mg/dL   Nitrite NEGATIVE NEGATIVE   Leukocytes,Ua MODERATE (A) NEGATIVE  CBC     Status: Abnormal   Collection Time: 02/11/20  9:57 AM  Result Value Ref Range   WBC 10.5 4.0 - 10.5 K/uL   RBC 4.65 3.87 - 5.11 MIL/uL   Hemoglobin 13.1 12.0 - 15.0 g/dL   HCT 40.0 36 - 46 %   MCV 86.0 80.0 - 100.0 fL   MCH 28.2 26.0 - 34.0 pg   MCHC 32.8 30.0 - 36.0 g/dL   RDW 19.9 (H) 11.5 - 15.5 %   Platelets 217 150 - 400 K/uL   nRBC 0.0 0.0 - 0.2 %  Comprehensive metabolic panel     Status: Abnormal   Collection Time: 02/11/20  9:57 AM  Result Value Ref Range   Sodium 135 135 - 145 mmol/L   Potassium 3.8 3.5 - 5.1 mmol/L   Chloride 103 98 - 111 mmol/L   CO2 20 (L) 22 - 32 mmol/L   Glucose, Bld 102 (H) 70 - 99 mg/dL   BUN 12 6 - 20 mg/dL   Creatinine, Ser 0.66 0.44 - 1.00 mg/dL   Calcium 9.7 8.9 - 10.3 mg/dL   Total Protein 7.3 6.5 - 8.1 g/dL   Albumin 3.4 (L) 3.5 - 5.0 g/dL   AST 17 15 - 41 U/L   ALT 15 0 - 44 U/L   Alkaline Phosphatase 48 38 - 126 U/L   Total Bilirubin 0.5 0.3 - 1.2 mg/dL   GFR, Estimated >60 >60 mL/min   Anion gap 12 5 - 15  Urinalysis, Microscopic (reflex)     Status: Abnormal   Collection Time: 02/11/20  9:57 AM  Result Value Ref Range   RBC / HPF 0-5 0 - 5 RBC/hpf   WBC, UA 21-50 0 - 5 WBC/hpf   Bacteria, UA FEW (A) NONE SEEN   Squamous Epithelial / LPF 6-10 0 - 5   Budding Yeast PRESENT    Hyphae Yeast PRESENT    MR PELVIS WO CONTRAST  Result Date: 02/11/2020 CLINICAL DATA:  Right lower quadrant pain. Suspected appendicitis. Twenty-two weeks pregnant. EXAM: MRI ABDOMEN AND PELVIS WITHOUT CONTRAST TECHNIQUE: Multiplanar multisequence MR imaging of the abdomen and pelvis was performed. No intravenous contrast was administered. COMPARISON:  None. FINDINGS: COMBINED FINDINGS FOR  BOTH MR ABDOMEN AND PELVIS Lower chest: No acute findings. Hepatobiliary: A 7 mm lesion is  seen in the lateral aspect of the right hepatic lobe which shows marked T2 hyperintensity, and may represent a tiny cyst or hemangioma. No suspicious liver masses are visualized on this unenhanced exam. Gallbladder is unremarkable. No evidence of biliary ductal dilatation. Pancreas: No mass or inflammatory process visualized on this unenhanced exam. Spleen:  Within normal limits in size. Adrenals/Urinary tract: A few tiny sub-cm renal cysts are noted bilaterally. No evidence of hydronephrosis. Insert bladder Stomach/Bowel: Normal appendix visualized. No evidence of bowel obstruction or inflammatory process. Vascular/Lymphatic: No pathologically enlarged lymph nodes identified. No evidence of abdominal aortic aneurysm. Reproductive: A single intrauterine fetus is seen in cephalic presentation. Several uterine fibroids are seen in the anterior, right lateral, and posterior uterine walls. Largest fibroid is subcapsular in location protruding into the right adnexal region, which measures 7.7 x 6.2 cm. Both ovaries are normal appearance. Small amount of free fluid is seen in the right abdomen and pelvis. Other:  None. Musculoskeletal:  No suspicious bone lesions identified. IMPRESSION: Single intrauterine fetus in cephalic presentation. No evidence of appendicitis. Multiple uterine fibroids, with largest measuring 7.7 cm and protruding into the right adnexal region. Small amount of free fluid in the right abdomen and pelvis. Electronically Signed   By: Marlaine Hind M.D.   On: 02/11/2020 12:53   MR ABDOMEN WO CONTRAST  Result Date: 02/11/2020 CLINICAL DATA:  Right lower quadrant pain. Suspected appendicitis. Twenty-two weeks pregnant. EXAM: MRI ABDOMEN AND PELVIS WITHOUT CONTRAST TECHNIQUE: Multiplanar multisequence MR imaging of the abdomen and pelvis was performed. No intravenous contrast was administered. COMPARISON:  None. FINDINGS: COMBINED FINDINGS FOR BOTH MR ABDOMEN AND PELVIS Lower chest: No acute findings.  Hepatobiliary: A 7 mm lesion is seen in the lateral aspect of the right hepatic lobe which shows marked T2 hyperintensity, and may represent a tiny cyst or hemangioma. No suspicious liver masses are visualized on this unenhanced exam. Gallbladder is unremarkable. No evidence of biliary ductal dilatation. Pancreas: No mass or inflammatory process visualized on this unenhanced exam. Spleen:  Within normal limits in size. Adrenals/Urinary tract: A few tiny sub-cm renal cysts are noted bilaterally. No evidence of hydronephrosis. Insert bladder Stomach/Bowel: Normal appendix visualized. No evidence of bowel obstruction or inflammatory process. Vascular/Lymphatic: No pathologically enlarged lymph nodes identified. No evidence of abdominal aortic aneurysm. Reproductive: A single intrauterine fetus is seen in cephalic presentation. Several uterine fibroids are seen in the anterior, right lateral, and posterior uterine walls. Largest fibroid is subcapsular in location protruding into the right adnexal region, which measures 7.7 x 6.2 cm. Both ovaries are normal appearance. Small amount of free fluid is seen in the right abdomen and pelvis. Other:  None. Musculoskeletal:  No suspicious bone lesions identified. IMPRESSION: Single intrauterine fetus in cephalic presentation. No evidence of appendicitis. Multiple uterine fibroids, with largest measuring 7.7 cm and protruding into the right adnexal region. Small amount of free fluid in the right abdomen and pelvis. Electronically Signed   By: Marlaine Hind M.D.   On: 02/11/2020 12:53   [place] MFM OB LIMITED  Result Date: 02/11/2020 ----------------------------------------------------------------------  OBSTETRICS REPORT                       (Signed Final 02/11/2020 12:06 pm) ---------------------------------------------------------------------- Patient Info  ID #:       [id number]  D.O.B.:  Sep 15, 1980 (39 yrs)  Name:       Tiffany Hoffman                     Visit Date: 02/11/2020 10:50 am ---------------------------------------------------------------------- Performed By  Attending:        Tama High MD        Ref. Address:     [address]                                                             [place]                                                             [place], [place]  Performed By:     Jeanene Erb BS,      [facility]                    [place]  Referred By:      Annalee Genta MD ---------------------------------------------------------------------- Orders  #  Description                           Code        Ordered By  1  [place] MFM OB LIMITED                     09735.32    [place] ----------------------------------------------------------------------  #  Order #                     Accession #                Episode #  1  [id number]                   [id number]                 [id number] ---------------------------------------------------------------------- Indications  Pelvic pain affecting pregnancy  in third       O26.893  trimester  Placenta previa specified as without           O44.02  hemorrhage, second trimester  Cervical shortening, second trimester          O26.872  Uterine fibroids affecting pregnancy in        O34.12, D25.9  second trimester, antepartum  Advanced maternal age multigravida 66+,        O60.522  second trimester  Fetal abnormality - other known or             O35.9XX0  suspected (short cervix, suspected in office)  Velamentous insertion of umbilical cord        Z61.096  [redacted] weeks gestation of pregnancy                Z3A.22 ---------------------------------------------------------------------- Fetal Evaluation  Num Of Fetuses:         1  Fetal Heart Rate(bpm):  145  Cardiac Activity:       Observed   Presentation:           Cephalic  Placenta:               Posterior Previa prev  P. Cord Insertion:      Velamentous insertion prev  Amniotic Fluid  AFI FV:      Within normal limits                              Largest Pocket(cm)                              3.9 ---------------------------------------------------------------------- OB History  Gravidity:    1         Term:   0        Prem:   0        SAB:   0  TOP:          0       Ectopic:  0        Living: 0 ---------------------------------------------------------------------- Gestational Age  LMP:           22w 2d        Date:  09/08/19                 EDD:   06/14/20  Best:          Kathrene Bongo 2d     Det. By:  LMP  (09/08/19)          EDD:   06/14/20 ---------------------------------------------------------------------- Cervix Uterus Adnexa  Cervix  Length:            2.2  cm.  Measured transabdominally  Adnexa  Small amout of free fluid in Right Adnexa.  Question linear structure inferior to Cecum  measures 5.3cm  Comment  Patient RLQ  pain. Observed rebound  tenderness over linear structure in RLQ. ---------------------------------------------------------------------- Myomas  Site                     L(cm)      W(cm)      D(cm)       Location  Right                    8          5.1        6.4 ----------------------------------------------------------------------  Blood Flow                  RI       PI       Comments ---------------------------------------------------------------------- Impression  Patient is being patient is being evaluated at the MAU for  complaints of abdominal pain.  Right lower quadrant  tenderness was elicited on examination.  A limited ultrasound study was performed .Amniotic fluid is  normal and good fetal activity is seen .  Right intramural  myoma is seen (measurements above).  Free fluid is seen in  the right adnexa.  Findings were communicated to Dr. Nehemiah Settle who ordered MRI  to rule out appendicitis.  ----------------------------------------------------------------------                  Tama High, MD Electronically Signed Final Report   02/11/2020 12:06 pm ----------------------------------------------------------------------    MAU Course  Procedures IVF given Labs as above Dilaudid 0.5mg , then 1mg  given with some pain relief.   MDM US shows 7cm fibroid in right adnexa MRI shows normal appendix  Assessment and Plan     ICD-10-CM   1. Pelvic pain affecting pregnancy in second trimester, antepartum  O26.892    R10.2   2. Pelvic pain  R10.2 [place] MFM OB LIMITED    [place] MFM OB LIMITED  3. Short cervical length during pregnancy in second trimester  O26.872   4. Velamentous insertion of umbilical cord in second trimester  O43.122   5. Placenta previa in second trimester  O44.02   6. [redacted] weeks gestation of pregnancy  Z3A.22    1. Pelvic pain in 2nd trimester pregnancy  MRI r/o appendicitis  WBC normal  I discussed patient with Dr Donalee Citrin of MFM - it appears that the patient's fibroid may be degenerating.   Also discussed patient with Dr Kris Hartmann with radiology. Right large fibroid has some edema - possibly degenerating.   I discussed patient with Dr Mancel Bale of [place], who will arrange close follow up. 2. 22 weeks pregnancy 3. Shortened cervix in pregnancy, 2nd trimester  Cervix appears to be stable 4. Velamentous insertion umbilical cord, 2nd trimester  Stable 5. Placenta Previa in 2nd Trimester  No active bleeding.    Truett Mainland 02/11/2020, 9:51 AM

## 2020-02-13 ENCOUNTER — Ambulatory Visit: Payer: 59 | Attending: Obstetrics and Gynecology

## 2020-02-13 ENCOUNTER — Other Ambulatory Visit: Payer: Self-pay

## 2020-02-13 ENCOUNTER — Ambulatory Visit: Payer: 59 | Admitting: *Deleted

## 2020-02-13 ENCOUNTER — Other Ambulatory Visit: Payer: Self-pay | Admitting: *Deleted

## 2020-02-13 DIAGNOSIS — O4402 Placenta previa specified as without hemorrhage, second trimester: Secondary | ICD-10-CM | POA: Diagnosis not present

## 2020-02-13 DIAGNOSIS — O43129 Velamentous insertion of umbilical cord, unspecified trimester: Secondary | ICD-10-CM

## 2020-02-13 DIAGNOSIS — O09522 Supervision of elderly multigravida, second trimester: Secondary | ICD-10-CM

## 2020-02-13 DIAGNOSIS — O43122 Velamentous insertion of umbilical cord, second trimester: Secondary | ICD-10-CM | POA: Insufficient documentation

## 2020-02-13 DIAGNOSIS — Z362 Encounter for other antenatal screening follow-up: Secondary | ICD-10-CM | POA: Insufficient documentation

## 2020-02-13 DIAGNOSIS — O26893 Other specified pregnancy related conditions, third trimester: Secondary | ICD-10-CM

## 2020-02-13 DIAGNOSIS — O3412 Maternal care for benign tumor of corpus uteri, second trimester: Secondary | ICD-10-CM

## 2020-02-13 DIAGNOSIS — O26872 Cervical shortening, second trimester: Secondary | ICD-10-CM | POA: Diagnosis not present

## 2020-02-13 DIAGNOSIS — O359XX0 Maternal care for (suspected) fetal abnormality and damage, unspecified, not applicable or unspecified: Secondary | ICD-10-CM

## 2020-02-13 DIAGNOSIS — Z3A22 22 weeks gestation of pregnancy: Secondary | ICD-10-CM

## 2020-02-13 DIAGNOSIS — D259 Leiomyoma of uterus, unspecified: Secondary | ICD-10-CM

## 2020-02-13 DIAGNOSIS — O4400 Placenta previa specified as without hemorrhage, unspecified trimester: Secondary | ICD-10-CM

## 2020-02-21 ENCOUNTER — Ambulatory Visit: Payer: 59

## 2020-02-27 ENCOUNTER — Ambulatory Visit: Payer: 59 | Attending: Obstetrics and Gynecology

## 2020-02-27 ENCOUNTER — Ambulatory Visit: Payer: 59 | Admitting: *Deleted

## 2020-02-27 ENCOUNTER — Other Ambulatory Visit: Payer: Self-pay

## 2020-02-27 ENCOUNTER — Encounter: Payer: Self-pay | Admitting: *Deleted

## 2020-02-27 VITALS — BP 124/81 | HR 81

## 2020-02-27 DIAGNOSIS — O09512 Supervision of elderly primigravida, second trimester: Secondary | ICD-10-CM | POA: Diagnosis present

## 2020-02-27 DIAGNOSIS — O4402 Placenta previa specified as without hemorrhage, second trimester: Secondary | ICD-10-CM

## 2020-02-27 DIAGNOSIS — Z3A24 24 weeks gestation of pregnancy: Secondary | ICD-10-CM

## 2020-02-27 DIAGNOSIS — O3412 Maternal care for benign tumor of corpus uteri, second trimester: Secondary | ICD-10-CM

## 2020-02-27 DIAGNOSIS — Z362 Encounter for other antenatal screening follow-up: Secondary | ICD-10-CM | POA: Diagnosis not present

## 2020-02-27 DIAGNOSIS — O09522 Supervision of elderly multigravida, second trimester: Secondary | ICD-10-CM

## 2020-02-27 DIAGNOSIS — R102 Pelvic and perineal pain: Secondary | ICD-10-CM

## 2020-02-27 DIAGNOSIS — O43122 Velamentous insertion of umbilical cord, second trimester: Secondary | ICD-10-CM

## 2020-02-27 DIAGNOSIS — O26879 Cervical shortening, unspecified trimester: Secondary | ICD-10-CM | POA: Insufficient documentation

## 2020-02-27 DIAGNOSIS — O26872 Cervical shortening, second trimester: Secondary | ICD-10-CM

## 2020-02-27 DIAGNOSIS — O26893 Other specified pregnancy related conditions, third trimester: Secondary | ICD-10-CM | POA: Diagnosis not present

## 2020-02-27 DIAGNOSIS — D259 Leiomyoma of uterus, unspecified: Secondary | ICD-10-CM

## 2020-03-12 ENCOUNTER — Ambulatory Visit: Payer: 59 | Admitting: *Deleted

## 2020-03-12 ENCOUNTER — Ambulatory Visit: Payer: 59 | Attending: Obstetrics and Gynecology

## 2020-03-12 ENCOUNTER — Encounter: Payer: Self-pay | Admitting: *Deleted

## 2020-03-12 ENCOUNTER — Other Ambulatory Visit: Payer: Self-pay

## 2020-03-12 ENCOUNTER — Other Ambulatory Visit: Payer: Self-pay | Admitting: *Deleted

## 2020-03-12 VITALS — BP 127/77 | HR 85

## 2020-03-12 DIAGNOSIS — O4402 Placenta previa specified as without hemorrhage, second trimester: Secondary | ICD-10-CM

## 2020-03-12 DIAGNOSIS — Z3A26 26 weeks gestation of pregnancy: Secondary | ICD-10-CM

## 2020-03-12 DIAGNOSIS — O359XX0 Maternal care for (suspected) fetal abnormality and damage, unspecified, not applicable or unspecified: Secondary | ICD-10-CM

## 2020-03-12 DIAGNOSIS — O26872 Cervical shortening, second trimester: Secondary | ICD-10-CM | POA: Diagnosis not present

## 2020-03-12 DIAGNOSIS — O43129 Velamentous insertion of umbilical cord, unspecified trimester: Secondary | ICD-10-CM

## 2020-03-12 DIAGNOSIS — O322XX0 Maternal care for transverse and oblique lie, not applicable or unspecified: Secondary | ICD-10-CM

## 2020-03-12 DIAGNOSIS — O26893 Other specified pregnancy related conditions, third trimester: Secondary | ICD-10-CM | POA: Diagnosis not present

## 2020-03-12 DIAGNOSIS — O09512 Supervision of elderly primigravida, second trimester: Secondary | ICD-10-CM

## 2020-03-12 DIAGNOSIS — D259 Leiomyoma of uterus, unspecified: Secondary | ICD-10-CM

## 2020-03-12 DIAGNOSIS — O4400 Placenta previa specified as without hemorrhage, unspecified trimester: Secondary | ICD-10-CM | POA: Diagnosis not present

## 2020-03-12 DIAGNOSIS — O09522 Supervision of elderly multigravida, second trimester: Secondary | ICD-10-CM

## 2020-03-12 DIAGNOSIS — O3412 Maternal care for benign tumor of corpus uteri, second trimester: Secondary | ICD-10-CM

## 2020-04-16 ENCOUNTER — Encounter: Payer: Self-pay | Admitting: *Deleted

## 2020-04-16 ENCOUNTER — Other Ambulatory Visit: Payer: Self-pay | Admitting: *Deleted

## 2020-04-16 ENCOUNTER — Ambulatory Visit: Payer: 59 | Admitting: *Deleted

## 2020-04-16 ENCOUNTER — Other Ambulatory Visit: Payer: Self-pay

## 2020-04-16 ENCOUNTER — Ambulatory Visit: Payer: 59 | Attending: Obstetrics and Gynecology

## 2020-04-16 VITALS — BP 115/68 | HR 96

## 2020-04-16 DIAGNOSIS — O4402 Placenta previa specified as without hemorrhage, second trimester: Secondary | ICD-10-CM | POA: Diagnosis present

## 2020-04-16 DIAGNOSIS — O283 Abnormal ultrasonic finding on antenatal screening of mother: Secondary | ICD-10-CM

## 2020-04-16 DIAGNOSIS — O26843 Uterine size-date discrepancy, third trimester: Secondary | ICD-10-CM | POA: Diagnosis not present

## 2020-04-16 DIAGNOSIS — O43123 Velamentous insertion of umbilical cord, third trimester: Secondary | ICD-10-CM | POA: Diagnosis not present

## 2020-04-16 DIAGNOSIS — O26873 Cervical shortening, third trimester: Secondary | ICD-10-CM

## 2020-04-16 DIAGNOSIS — O09513 Supervision of elderly primigravida, third trimester: Secondary | ICD-10-CM

## 2020-04-16 DIAGNOSIS — O4403 Placenta previa specified as without hemorrhage, third trimester: Secondary | ICD-10-CM | POA: Diagnosis not present

## 2020-04-16 DIAGNOSIS — O280 Abnormal hematological finding on antenatal screening of mother: Secondary | ICD-10-CM

## 2020-04-16 DIAGNOSIS — Z3A31 31 weeks gestation of pregnancy: Secondary | ICD-10-CM

## 2020-05-01 ENCOUNTER — Telehealth (HOSPITAL_COMMUNITY): Payer: Self-pay | Admitting: *Deleted

## 2020-05-01 NOTE — Patient Instructions (Signed)
Tiffany Hoffman  05/01/2020   Your procedure is scheduled on:  05/13/2020  Arrive at 0530 at [employer] [facility] on [address] at [facility]  and [employer]. You are invited to use the FREE valet parking or use the Visitor's parking deck.  Pick up the phone at the desk and dial [phone].  Call this number if you have problems the morning of surgery: [phone]  Remember:   Do not eat food:(After Midnight) Desps de medianoche.  Do not drink clear liquids: (After Midnight) Desps de medianoche.  Take these medicines the morning of surgery with A SIP OF WATER:  none   Do not wear jewelry, make-up or nail polish.  Do not wear lotions, powders, or perfumes. Do not wear deodorant.  Do not shave 48 hours prior to surgery.  Do not bring valuables to the hospital.  [facility] is not   responsible for any belongings or valuables brought to the hospital.  Contacts, dentures or bridgework may not be worn into surgery.  Leave suitcase in the car. After surgery it may be brought to your room.  For patients admitted to the hospital, checkout time is 11:00 AM the day of              discharge.      Please read over the following fact sheets that you were given:     Preparing for Surgery

## 2020-05-01 NOTE — Telephone Encounter (Signed)
Preadmission screen  

## 2020-05-05 ENCOUNTER — Telehealth (HOSPITAL_COMMUNITY): Payer: Self-pay | Admitting: *Deleted

## 2020-05-05 NOTE — Telephone Encounter (Signed)
Preadmission screen  

## 2020-05-06 ENCOUNTER — Encounter (HOSPITAL_COMMUNITY): Payer: Self-pay

## 2020-05-08 ENCOUNTER — Ambulatory Visit (HOSPITAL_BASED_OUTPATIENT_CLINIC_OR_DEPARTMENT_OTHER): Payer: 59

## 2020-05-08 ENCOUNTER — Ambulatory Visit: Payer: 59 | Admitting: *Deleted

## 2020-05-08 ENCOUNTER — Inpatient Hospital Stay (HOSPITAL_COMMUNITY)
Admission: AD | Admit: 2020-05-08 | Discharge: 2020-05-16 | DRG: 787 | Disposition: A | Discharge status: Home / Self Care | Payer: 59 | Attending: Obstetrics and Gynecology | Admitting: Obstetrics and Gynecology

## 2020-05-08 ENCOUNTER — Ambulatory Visit: Payer: 59

## 2020-05-08 ENCOUNTER — Encounter: Payer: Self-pay | Admitting: *Deleted

## 2020-05-08 ENCOUNTER — Encounter (HOSPITAL_COMMUNITY): Payer: Self-pay | Admitting: Obstetrics and Gynecology

## 2020-05-08 ENCOUNTER — Other Ambulatory Visit: Payer: Self-pay

## 2020-05-08 DIAGNOSIS — O99893 Other specified diseases and conditions complicating puerperium: Secondary | ICD-10-CM | POA: Diagnosis present

## 2020-05-08 DIAGNOSIS — O4403 Placenta previa specified as without hemorrhage, third trimester: Secondary | ICD-10-CM

## 2020-05-08 DIAGNOSIS — Z20822 Contact with and (suspected) exposure to covid-19: Secondary | ICD-10-CM | POA: Diagnosis present

## 2020-05-08 DIAGNOSIS — O26843 Uterine size-date discrepancy, third trimester: Secondary | ICD-10-CM

## 2020-05-08 DIAGNOSIS — Z3A34 34 weeks gestation of pregnancy: Secondary | ICD-10-CM

## 2020-05-08 DIAGNOSIS — O09513 Supervision of elderly primigravida, third trimester: Secondary | ICD-10-CM | POA: Insufficient documentation

## 2020-05-08 DIAGNOSIS — O26873 Cervical shortening, third trimester: Secondary | ICD-10-CM | POA: Diagnosis present

## 2020-05-08 DIAGNOSIS — O280 Abnormal hematological finding on antenatal screening of mother: Secondary | ICD-10-CM

## 2020-05-08 DIAGNOSIS — O288 Other abnormal findings on antenatal screening of mother: Secondary | ICD-10-CM

## 2020-05-08 DIAGNOSIS — D259 Leiomyoma of uterus, unspecified: Secondary | ICD-10-CM | POA: Diagnosis present

## 2020-05-08 DIAGNOSIS — O43123 Velamentous insertion of umbilical cord, third trimester: Secondary | ICD-10-CM

## 2020-05-08 DIAGNOSIS — O3413 Maternal care for benign tumor of corpus uteri, third trimester: Secondary | ICD-10-CM | POA: Diagnosis present

## 2020-05-08 DIAGNOSIS — R079 Chest pain, unspecified: Secondary | ICD-10-CM | POA: Diagnosis present

## 2020-05-08 DIAGNOSIS — O4413 Placenta previa with hemorrhage, third trimester: Secondary | ICD-10-CM | POA: Insufficient documentation

## 2020-05-08 DIAGNOSIS — O43129 Velamentous insertion of umbilical cord, unspecified trimester: Secondary | ICD-10-CM | POA: Diagnosis not present

## 2020-05-08 DIAGNOSIS — O4400 Placenta previa specified as without hemorrhage, unspecified trimester: Secondary | ICD-10-CM | POA: Diagnosis present

## 2020-05-08 LAB — RESP PANEL BY RT-PCR (RSV, FLU A&B, COVID)  RVPGX2
Influenza A by PCR: NEGATIVE
Influenza B by PCR: NEGATIVE
Resp Syncytial Virus by PCR: NEGATIVE
SARS Coronavirus 2 by RT PCR: NEGATIVE

## 2020-05-08 LAB — CBC
HCT: 35.2 % — ABNORMAL LOW (ref 36.0–46.0)
Hemoglobin: 12.2 g/dL (ref 12.0–15.0)
MCH: 32.4 pg (ref 26.0–34.0)
MCHC: 34.7 g/dL (ref 30.0–36.0)
MCV: 93.4 fL (ref 80.0–100.0)
Platelets: 204 10*3/uL (ref 150–400)
RBC: 3.77 MIL/uL — ABNORMAL LOW (ref 3.87–5.11)
RDW: 13.1 % (ref 11.5–15.5)
WBC: 6.8 10*3/uL (ref 4.0–10.5)
nRBC: 0 % (ref 0.0–0.2)

## 2020-05-08 LAB — TYPE AND SCREEN
ABO/RH(D): B POS
Antibody Screen: NEGATIVE

## 2020-05-08 MED ORDER — ACETAMINOPHEN 325 MG PO TABS: 650.0000 mg | ORAL_TABLET | ORAL | Status: DC | PRN

## 2020-05-08 MED ORDER — PROGESTERONE 200 MG PO CAPS
200.0000 mg | ORAL_CAPSULE | Freq: Every day | ORAL | Status: DC
  Administered 2020-05-08 – 2020-05-12 (×5): 200 mg via VAGINAL
  Filled 2020-05-08 (×9): qty 1

## 2020-05-08 MED ORDER — BETAMETHASONE SOD PHOS & ACET 6 (3-3) MG/ML IJ SUSP
12.0000 mg | INTRAMUSCULAR | Status: AC
  Administered 2020-05-08 – 2020-05-09 (×2): 12 mg via INTRAMUSCULAR
  Filled 2020-05-08: qty 5

## 2020-05-08 MED ORDER — CALCIUM CARBONATE ANTACID 500 MG PO CHEW: 2.0000 | CHEWABLE_TABLET | ORAL | Status: DC | PRN

## 2020-05-08 MED ORDER — SODIUM CHLORIDE 0.9 % IV SOLN: 250.0000 mL | INTRAVENOUS | Status: DC | PRN

## 2020-05-08 MED ORDER — ZOLPIDEM TARTRATE 5 MG PO TABS: 5.0000 mg | ORAL_TABLET | Freq: Every evening | ORAL | Status: DC | PRN

## 2020-05-08 MED ORDER — PRENATAL MULTIVITAMIN CH
1.0000 | ORAL_TABLET | Freq: Every day | ORAL | Status: DC
  Administered 2020-05-09 – 2020-05-12 (×4): 1 via ORAL
  Filled 2020-05-08 (×4): qty 1

## 2020-05-08 MED ORDER — SODIUM CHLORIDE 0.9% FLUSH
3.0000 mL | Freq: Two times a day (BID) | INTRAVENOUS | Status: DC
  Administered 2020-05-08 – 2020-05-12 (×10): 3 mL via INTRAVENOUS

## 2020-05-08 MED ORDER — SODIUM CHLORIDE 0.9% FLUSH: 3.0000 mL | INTRAVENOUS | Status: DC | PRN

## 2020-05-08 MED ORDER — DOCUSATE SODIUM 100 MG PO CAPS
100.0000 mg | ORAL_CAPSULE | Freq: Every day | ORAL | Status: DC
  Administered 2020-05-09 – 2020-05-10 (×2): 100 mg via ORAL
  Filled 2020-05-08 (×2): qty 1

## 2020-05-08 NOTE — H&P (Signed)
Tiffany Hoffman is a 40 y.o. female G1P0 at 33 weeks and 5 days  presenting for  Admission due to vasa previa and placenta previa. Pt was seen in MFM office today by Dr. Daisy Floro who recommends inpatient management until delivery via cesarean section scheduled for 05/13/2020. Pt denies any current bleeding. Her pregnancy is also complicated by multiple uterine fibroids ( most posterior) shortened cervix and advanced maternal age.  Abnormal quad screen 1/153 risk for downs pt declined further testing. Prenatal care provided by Dr. Gerald Leitz with [facility]. . OB History    Gravida  1   Para      Term      Preterm      AB      Living  0     SAB      IAB      Ectopic      Multiple      Live Births             Past Medical History:  Diagnosis Date  . Fibroid    Past Surgical History:  Procedure Laterality Date  . NO PAST SURGERIES     Family History: family history includes Diabetes in her mother; Heart disease in her mother; Hypertension in her brother, father, mother, and sister. Social History:  reports that she has never smoked. She has never used smokeless tobacco. She reports previous drug use. She reports that she does not drink alcohol.     Maternal Diabetes: No Genetic Screening: Abnormal:  Results: Elevated risk of Trisomy 21 Maternal Ultrasounds/Referrals: Normal Fetal Ultrasounds or other Referrals:  None Maternal Substance Abuse:  No Significant Maternal Medications:  None Significant Maternal Lab Results:  None Other Comments:  None  Review of Systems  Constitutional: Negative.   HENT: Negative.   Eyes: Negative.   Respiratory: Negative.   Cardiovascular: Negative.   Gastrointestinal: Negative.   Endocrine: Negative.   Genitourinary: Negative.   Musculoskeletal: Negative.   Skin: Negative.   Allergic/Immunologic: Negative.   Neurological: Negative.   Hematological: Negative.   Psychiatric/Behavioral: Negative.    History   Blood  pressure 127/85, pulse 81, temperature 98.3 F (36.8 C), temperature source Oral, resp. rate 17, height 5\' 5"  (1.651 m), weight 94.8 kg, last menstrual period 09/08/2019, SpO2 99 %. Exam Physical Exam Vitals reviewed.  Constitutional:      Appearance: Normal appearance.  HENT:     Head: Normocephalic and atraumatic.  Cardiovascular:     Rate and Rhythm: Normal rate and regular rhythm.     Pulses: Normal pulses.     Heart sounds: Normal heart sounds.  Pulmonary:     Effort: Pulmonary effort is normal.     Breath sounds: Normal breath sounds.  Abdominal:     Tenderness: There is no abdominal tenderness.  Musculoskeletal:        General: Normal range of motion.     Cervical back: Normal range of motion and neck supple.  Skin:    General: Skin is warm and dry.  Neurological:     General: No focal deficit present.     Mental Status: She is alert and oriented to person, place, and time.  Psychiatric:        Mood and Affect: Mood normal.        Behavior: Behavior normal.     Prenatal labs: ABO, Rh: --/--/B POS (01/06 1450) Antibody: NEG (01/06 1450) Rubella:  Immune  RPR:   Negative  HBsAg:  Negative  HIV:   Negative  GBS:   Pending  Assessment/Plan: 34 wks and 5 days with vasa previa  And posterior placenta previa- currently stable without active bleeding.  - admit for inpatient management with modified bedrest until delivery (planned via cesarean section on 05/13/2020) per Dr. Daisy Floro.  - betamethasone for fetal lung maturity - Recommend cesarean section. R/B/A of cesarean section discussed with the patient including but not limited to infection, bleeding damage to bowel bladder and baby with the need for further surgery. R/O transfusion HIV/ Hep B&C discussed. Pt voiced understanding and desires to proceed with cesarean section.  And accepts transfusion if necessary  - Neonatology consult   * Uterine fibroids - multiple the majority and largest are posterior. The  largest is 6.0 cm.  * Cervical shortening- continue vaginal progesterone 200 mg daily  *Abnormal quad screen 1/153 for downs - pt declined further testing.    Gerald Leitz 05/08/2020, 7:01 PM

## 2020-05-09 ENCOUNTER — Observation Stay (HOSPITAL_BASED_OUTPATIENT_CLINIC_OR_DEPARTMENT_OTHER): Payer: 59

## 2020-05-09 DIAGNOSIS — O43123 Velamentous insertion of umbilical cord, third trimester: Secondary | ICD-10-CM | POA: Diagnosis not present

## 2020-05-09 DIAGNOSIS — O26873 Cervical shortening, third trimester: Secondary | ICD-10-CM

## 2020-05-09 DIAGNOSIS — Z3A34 34 weeks gestation of pregnancy: Secondary | ICD-10-CM

## 2020-05-09 DIAGNOSIS — O4403 Placenta previa specified as without hemorrhage, third trimester: Secondary | ICD-10-CM

## 2020-05-09 DIAGNOSIS — O26843 Uterine size-date discrepancy, third trimester: Secondary | ICD-10-CM

## 2020-05-09 DIAGNOSIS — O09513 Supervision of elderly primigravida, third trimester: Secondary | ICD-10-CM

## 2020-05-09 NOTE — Progress Notes (Signed)
Antepartum Progress Note  S: Patient is doing well.  Did not rest well last night.  Otherwise, feeling okay and understands plan of care.  Endorses good FM.  Denies VB, LOF, or CTX.  O: BP 138/75 (BP Location: Right Arm)   Pulse 79   Temp 98.3 F (36.8 C) (Oral)   Resp 16   Ht 5\' 5"  (1.651 m)   Wt 94.8 kg   LMP 09/08/2019   SpO2 98%   BMI 34.78 kg/m   Gen:  NAD, pleasant and cooperative Cardio:  RRR Pulm:  CTAB, no wheezes/rales/rhonchi Abd:  Soft, gravid, non-tender throughout Ext:  No bilateral LE edema  FHT: 140bpm, moderate variablity, 10x10 accels, no decels Toco: no contractions, some irritability  A/P: 40 y.o. G1P0  @ [redacted]w[redacted]d admitted for inpatient management of vasa previa and posterior placenta previa.  Vasa previa and posterior placenta previa - Inpatient management until delivery on 05/13/20, per MFM (Dr. Gaynell Face) - Betamethasone for [facility] - Consented previously for C/S and blood products - NICU consult ordered - Daily NSTs - today's was reassuring but non-reactive, BPP ordered  Uterine fibroids Multiple the majority and largest are posterior - largest 6cm  Cervical shortening - Vaginal progesterone 200mg  daily  AMA (40) - Quad screen 1/153 for DS, patient declined further testing   Drema Dallas, DO [phone] (office)

## 2020-05-10 LAB — CULTURE, BETA STREP (GROUP B ONLY)

## 2020-05-10 NOTE — Procedures (Signed)
Fetal Non-Stress Test  Patient Name:  Tiffany Hoffman   Gestational age:  [redacted]w[redacted]d Indication(s):  Placenta previa, Vasa previa  Fetal Heart Tracing: Baseline:  125-130bpm Variability:  Moderate Accelerations:  15x15s present Decelerations:  None  Toco:  No contractions  Interpretation:  Reactive NST  Drema Dallas, DO

## 2020-05-10 NOTE — Progress Notes (Addendum)
Antepartum Progress Note  S: Patient is doing well. Feeling good overall. Endorses good FM.  Denies VB, LOF, or CTX.  O: BP 128/76   Pulse 81   Temp 98.2 F (36.8 C) (Oral)   Resp 16   Ht 5\' 5"  (1.651 m)   Wt 94.8 kg   LMP 09/08/2019   SpO2 96%   BMI 34.78 kg/m   Gen:  NAD, pleasant and cooperative Pulm: Normal work of breathing Abd:  Soft, gravid, non-tender throughout Ext:  No bilateral LE edema  A/P: 40 y.o. G1P0  @ [redacted]w[redacted]d  admitted for inpatient management of vasa previa and posterior placenta previa.  Vasa previa and posterior placenta previa - Inpatient management until delivery on 05/13/20, per MFM (Dr. Gaynell Face) - Betamethasone for [facility] - Consented previously for C/S and blood products - NICU consult ordered - BPP 8/8 yesterday, per verbal report - Twice daily NSTs - had not had NST yet today  Uterine fibroids Multiple the majority and largest are posterior - largest 6cm  Cervical shortening - Vaginal progesterone 200mg  daily  AMA (40) - Quad screen 1/153 for DS, patient declined further testing   Drema Dallas, DO [phone] (office)

## 2020-05-11 NOTE — Procedures (Signed)
Fetal Non-Stress Test  Patient Name:  Tiffany Hoffman   Gestational age:  [redacted]w[redacted]d Indication(s):  Vasa previa, placenta previa  Baseline:  135bpm Variability:  moderate Accelerations:  15x15s present Decelerations:  None Contractions:  None  Interpretation:  Reactive NST  Drema Dallas, DO

## 2020-05-11 NOTE — Progress Notes (Addendum)
Antepartum Progress Note  S: Patient is doing well.  Reported a headache that is now resolved - states she feels she did not eat enough.  Reports bilateral lower pelvic discomfort but no overt pain or contractions.  Reports overall body soreness from not having slept.  Parents coming in from another state - asks about how many additional support people she may have. Had infiltrated IV and does not prefer another one but understands why she needs one.  All questions invited and answered. Endorses good FM.  Denies VB, LOF, or CTX.  Denies fevers, chills, chest pain, visual changes, SOB, RUQ/epigastric pain, N/V, dysuria, hematuria, or sudden onset/worsening bilateral LE or facial edema.  O: BP 121/65 (BP Location: Right Arm)   Pulse 76   Temp 98.4 F (36.9 C) (Oral)   Resp 18   Ht 5\' 5"  (1.651 m)   Wt 94.8 kg   LMP 09/08/2019   SpO2 98%   BMI 34.78 kg/m   Gen:  NAD, pleasant and cooperative Cardio:  RRR Pulm: Normal work of breathing Abd:  Soft, gravid, non-tender throughout Ext:  No bilateral LE edema, no bilateral calf tenderness  A/P: 40 y.o. G1P0  @ [redacted]w[redacted]d admitted for inpatient management of vasa previa and posterior placenta previa.  Vasa previa and posterior placenta previa - Inpatient management until delivery on 05/13/20, per MFM (Dr. Gaynell Face) - Betamethasone complete 05/10/20 - Repeat T&S ordered  - Consented previously for C/S and blood products - NICU consult ordered - Twice daily NSTs - reactive NST today  Uterine fibroids Multiple the majority and largest are posterior - largest 6cm  Cervical shortening - Vaginal progesterone 200mg  daily  AMA (40) - Quad screen 1/153 for DS, patient declined further testing   Drema Dallas, DO [phone] (office)

## 2020-05-11 NOTE — Plan of Care (Signed)
  Problem: Education: Goal: Knowledge of General Education information will improve Description: Including pain rating scale, medication(s)/side effects and non-pharmacologic comfort measures Outcome: Completed/Met   Problem: Activity: Goal: Risk for activity intolerance will decrease Outcome: Completed/Met   Problem: Nutrition: Goal: Adequate nutrition will be maintained Outcome: Completed/Met   Problem: Coping: Goal: Level of anxiety will decrease Outcome: Completed/Met   Problem: Elimination: Goal: Will not experience complications related to urinary retention Outcome: Completed/Met   Problem: Education: Goal: Knowledge of disease or condition will improve Outcome: Completed/Met

## 2020-05-12 ENCOUNTER — Encounter (HOSPITAL_COMMUNITY)
Admission: RE | Admit: 2020-05-12 | Discharge: 2020-05-12 | Disposition: A | Discharge status: Home / Self Care | Payer: 59 | Source: Ambulatory Visit | Attending: Obstetrics and Gynecology | Admitting: Obstetrics and Gynecology

## 2020-05-12 ENCOUNTER — Other Ambulatory Visit (HOSPITAL_COMMUNITY): Payer: 59

## 2020-05-12 MED ORDER — CEFAZOLIN SODIUM-DEXTROSE 2-4 GM/100ML-% IV SOLN
2.0000 g | INTRAVENOUS | Status: DC
  Filled 2020-05-12: qty 100

## 2020-05-12 MED ORDER — DEXTROSE 5 % IV SOLN
2.0000 g | INTRAVENOUS | Status: DC
  Filled 2020-05-12: qty 20

## 2020-05-12 MED ORDER — SOD CITRATE-CITRIC ACID 500-334 MG/5ML PO SOLN
30.0000 mL | ORAL | Status: AC
  Administered 2020-05-13: 30 mL via ORAL
  Filled 2020-05-12: qty 15

## 2020-05-12 NOTE — Progress Notes (Addendum)
Patient ID: Tiffany Hoffman, female   DOB: 15-May-1980, 40 y.o.   MRN: [id number]   Patient denies contractions.  +FM no lof no vaginal bleeding. Tenderness in lower abdomen that is mild.   Vitals:   05/11/20 1803 05/11/20 2006 05/12/20 0328 05/12/20 0749  BP: 127/70 123/74 117/70 122/72  Pulse: 78 83 82 78  Resp: 18 16  16   Temp: 98.4 F (36.9 C) 98.2 F (36.8 C) 97.7 F (36.5 C) 98.1 F (36.7 C)  TempSrc: Oral  Oral Oral  SpO2: 99% 99%  99%  Weight:      Height:        General Alert and oriented no acute distress  Lungs: no distress  Abdomen gravid nontender  Extremeties no edema no evidence of DVT  NST from 01/09 reviewed. Reactive/... contractions noted every 3-6 minutes ( pt denies feeling contractions)     A/P: 40 y.o. G1P0  @ [redacted]w[redacted]d admitted for inpatient management of vasa previa and posterior placenta previa.  Vasa previa and posterior placenta previa - Inpatient management until delivery on 05/13/20, per MFM (Dr. Gaynell Face) - Betamethasone complete 05/10/20 - Consented previously for C/S and blood products - NICU consult ordered - Twice daily NSTs - reactive NST last night  - NPO after midnight tonight.   Uterine fibroids Multiple the majority and largest are posterior - largest 6cm  Cervical shortening - Vaginal progesterone 200mg  daily  AMA (40) - Quad screen 1/153 for DS, patient declined further testing

## 2020-05-13 ENCOUNTER — Encounter (HOSPITAL_COMMUNITY): Payer: Self-pay | Admitting: Obstetrics and Gynecology

## 2020-05-13 ENCOUNTER — Inpatient Hospital Stay (HOSPITAL_COMMUNITY): Admission: RE | Admit: 2020-05-13 | Payer: 59 | Source: Home / Self Care | Admitting: Obstetrics and Gynecology

## 2020-05-13 ENCOUNTER — Observation Stay (HOSPITAL_COMMUNITY): Payer: 59 | Admitting: Anesthesiology

## 2020-05-13 ENCOUNTER — Encounter (HOSPITAL_COMMUNITY)
Admission: AD | Disposition: A | Discharge status: Home / Self Care | Payer: Self-pay | Source: Home / Self Care | Attending: Obstetrics and Gynecology

## 2020-05-13 DIAGNOSIS — O4400 Placenta previa specified as without hemorrhage, unspecified trimester: Secondary | ICD-10-CM | POA: Diagnosis present

## 2020-05-13 LAB — CBC
HCT: 32.7 % — ABNORMAL LOW (ref 36.0–46.0)
Hemoglobin: 11.4 g/dL — ABNORMAL LOW (ref 12.0–15.0)
MCH: 32.9 pg (ref 26.0–34.0)
MCHC: 34.9 g/dL (ref 30.0–36.0)
MCV: 94.2 fL (ref 80.0–100.0)
Platelets: 200 10*3/uL (ref 150–400)
RBC: 3.47 MIL/uL — ABNORMAL LOW (ref 3.87–5.11)
RDW: 13 % (ref 11.5–15.5)
WBC: 6.9 10*3/uL (ref 4.0–10.5)
nRBC: 0 % (ref 0.0–0.2)

## 2020-05-13 LAB — PREPARE RBC (CROSSMATCH)

## 2020-05-13 SURGERY — Surgical Case
Anesthesia: Spinal | Wound class: Clean Contaminated

## 2020-05-13 MED ORDER — NALOXONE HCL 0.4 MG/ML IJ SOLN
0.4000 mg | INTRAMUSCULAR | Status: DC | PRN
Start: 2020-05-13 — End: 2020-05-16

## 2020-05-13 MED ORDER — COCONUT OIL OIL: 1.0000 "application " | TOPICAL_OIL | Status: DC | PRN

## 2020-05-13 MED ORDER — OXYTOCIN-SODIUM CHLORIDE 30-0.9 UT/500ML-% IV SOLN
INTRAVENOUS | Status: AC
  Filled 2020-05-13: qty 500

## 2020-05-13 MED ORDER — ACETAMINOPHEN 10 MG/ML IV SOLN
1000.0000 mg | Freq: Once | INTRAVENOUS | Status: DC | PRN
  Administered 2020-05-13: 1000 mg via INTRAVENOUS

## 2020-05-13 MED ORDER — OXYTOCIN-SODIUM CHLORIDE 30-0.9 UT/500ML-% IV SOLN: 2.5000 [IU]/h | INTRAVENOUS | Status: AC

## 2020-05-13 MED ORDER — FENTANYL CITRATE (PF) 100 MCG/2ML IJ SOLN
INTRAMUSCULAR | Status: DC | PRN
  Administered 2020-05-13: 85 ug via INTRAVENOUS

## 2020-05-13 MED ORDER — PHENYLEPHRINE HCL-NACL 20-0.9 MG/250ML-% IV SOLN
INTRAVENOUS | Status: AC
  Filled 2020-05-13: qty 250

## 2020-05-13 MED ORDER — AMISULPRIDE (ANTIEMETIC) 5 MG/2ML IV SOLN: 10.0000 mg | Freq: Once | INTRAVENOUS | Status: DC | PRN

## 2020-05-13 MED ORDER — STERILE WATER FOR IRRIGATION IR SOLN
Status: DC | PRN
  Administered 2020-05-13: 1000 mL

## 2020-05-13 MED ORDER — ZOLPIDEM TARTRATE 5 MG PO TABS: 5.0000 mg | ORAL_TABLET | Freq: Every evening | ORAL | Status: DC | PRN

## 2020-05-13 MED ORDER — TRANEXAMIC ACID-NACL 1000-0.7 MG/100ML-% IV SOLN
INTRAVENOUS | Status: DC | PRN
  Administered 2020-05-13: 1000 mg via INTRAVENOUS

## 2020-05-13 MED ORDER — MORPHINE SULFATE (PF) 0.5 MG/ML IJ SOLN
INTRAMUSCULAR | Status: DC | PRN
  Administered 2020-05-13: 150 ug via INTRATHECAL

## 2020-05-13 MED ORDER — SODIUM CHLORIDE 0.9% IV SOLUTION: Freq: Once | INTRAVENOUS | Status: DC

## 2020-05-13 MED ORDER — NALOXONE HCL 4 MG/10ML IJ SOLN
1.0000 ug/kg/h | INTRAVENOUS | Status: DC | PRN
  Filled 2020-05-13: qty 5

## 2020-05-13 MED ORDER — BUPIVACAINE IN DEXTROSE 0.75-8.25 % IT SOLN
INTRATHECAL | Status: DC | PRN
  Administered 2020-05-13: 1.7 mL via INTRATHECAL

## 2020-05-13 MED ORDER — SIMETHICONE 80 MG PO CHEW: 80.0000 mg | CHEWABLE_TABLET | ORAL | Status: DC | PRN

## 2020-05-13 MED ORDER — FENTANYL CITRATE (PF) 100 MCG/2ML IJ SOLN
INTRAMUSCULAR | Status: AC
  Filled 2020-05-13: qty 2

## 2020-05-13 MED ORDER — PHENYLEPHRINE 40 MCG/ML (10ML) SYRINGE FOR IV PUSH (FOR BLOOD PRESSURE SUPPORT)
PREFILLED_SYRINGE | INTRAVENOUS | Status: AC
  Filled 2020-05-13: qty 10

## 2020-05-13 MED ORDER — PRENATAL MULTIVITAMIN CH
1.0000 | ORAL_TABLET | Freq: Every day | ORAL | Status: DC
  Administered 2020-05-14 – 2020-05-16 (×3): 1 via ORAL
  Filled 2020-05-13 (×3): qty 1

## 2020-05-13 MED ORDER — OXYCODONE HCL 5 MG PO TABS: 5.0000 mg | ORAL_TABLET | Freq: Once | ORAL | Status: DC | PRN

## 2020-05-13 MED ORDER — TRANEXAMIC ACID-NACL 1000-0.7 MG/100ML-% IV SOLN
INTRAVENOUS | Status: AC
  Filled 2020-05-13: qty 100

## 2020-05-13 MED ORDER — NALBUPHINE HCL 10 MG/ML IJ SOLN: 5.0000 mg | Freq: Once | INTRAMUSCULAR | Status: DC | PRN

## 2020-05-13 MED ORDER — PHENYLEPHRINE HCL-NACL 20-0.9 MG/250ML-% IV SOLN
INTRAVENOUS | Status: DC | PRN
  Administered 2020-05-13: 60 ug/min via INTRAVENOUS

## 2020-05-13 MED ORDER — WITCH HAZEL-GLYCERIN EX PADS: 1.0000 "application " | MEDICATED_PAD | CUTANEOUS | Status: DC | PRN

## 2020-05-13 MED ORDER — DIPHENHYDRAMINE HCL 25 MG PO CAPS: 25.0000 mg | ORAL_CAPSULE | Freq: Four times a day (QID) | ORAL | Status: DC | PRN

## 2020-05-13 MED ORDER — SODIUM CHLORIDE 0.9 % IR SOLN
Status: DC | PRN
  Administered 2020-05-13: 1000 mL

## 2020-05-13 MED ORDER — OXYCODONE HCL 5 MG/5ML PO SOLN: 5.0000 mg | Freq: Once | ORAL | Status: DC | PRN

## 2020-05-13 MED ORDER — MENTHOL 3 MG MT LOZG: 1.0000 | LOZENGE | OROMUCOSAL | Status: DC | PRN

## 2020-05-13 MED ORDER — IBUPROFEN 800 MG PO TABS
800.0000 mg | ORAL_TABLET | Freq: Three times a day (TID) | ORAL | Status: AC
Start: 2020-05-13 — End: 2020-05-16
  Administered 2020-05-13 – 2020-05-16 (×9): 800 mg via ORAL
  Filled 2020-05-13 (×9): qty 1

## 2020-05-13 MED ORDER — METHYLERGONOVINE MALEATE 0.2 MG PO TABS
0.2000 mg | ORAL_TABLET | ORAL | Status: DC | PRN
Start: 2020-05-13 — End: 2020-05-16

## 2020-05-13 MED ORDER — ONDANSETRON HCL 4 MG/2ML IJ SOLN
4.0000 mg | Freq: Three times a day (TID) | INTRAMUSCULAR | Status: DC | PRN
  Administered 2020-05-13: 4 mg via INTRAVENOUS
  Filled 2020-05-13: qty 2

## 2020-05-13 MED ORDER — FENTANYL CITRATE (PF) 100 MCG/2ML IJ SOLN
INTRAMUSCULAR | Status: DC | PRN
  Administered 2020-05-13: 15 ug via INTRATHECAL

## 2020-05-13 MED ORDER — KETOROLAC TROMETHAMINE 30 MG/ML IJ SOLN: 30.0000 mg | Freq: Four times a day (QID) | INTRAMUSCULAR | Status: DC | PRN

## 2020-05-13 MED ORDER — PROMETHAZINE HCL 25 MG/ML IJ SOLN: 6.2500 mg | INTRAMUSCULAR | Status: DC | PRN

## 2020-05-13 MED ORDER — ACETAMINOPHEN 500 MG PO TABS: 1000.0000 mg | ORAL_TABLET | Freq: Four times a day (QID) | ORAL | Status: DC

## 2020-05-13 MED ORDER — NALBUPHINE HCL 10 MG/ML IJ SOLN: 5.0000 mg | INTRAMUSCULAR | Status: DC | PRN

## 2020-05-13 MED ORDER — DIPHENHYDRAMINE HCL 25 MG PO CAPS: 25.0000 mg | ORAL_CAPSULE | ORAL | Status: DC | PRN

## 2020-05-13 MED ORDER — LACTATED RINGERS IV SOLN: INTRAVENOUS | Status: DC | PRN

## 2020-05-13 MED ORDER — SIMETHICONE 80 MG PO CHEW
80.0000 mg | CHEWABLE_TABLET | Freq: Three times a day (TID) | ORAL | Status: DC
  Administered 2020-05-13 – 2020-05-16 (×9): 80 mg via ORAL
  Filled 2020-05-13 (×9): qty 1

## 2020-05-13 MED ORDER — FENTANYL CITRATE (PF) 100 MCG/2ML IJ SOLN: 25.0000 ug | INTRAMUSCULAR | Status: DC | PRN

## 2020-05-13 MED ORDER — DIBUCAINE (PERIANAL) 1 % EX OINT: 1.0000 "application " | TOPICAL_OINTMENT | CUTANEOUS | Status: DC | PRN

## 2020-05-13 MED ORDER — KETOROLAC TROMETHAMINE 30 MG/ML IJ SOLN
INTRAMUSCULAR | Status: AC
  Filled 2020-05-13: qty 1

## 2020-05-13 MED ORDER — ONDANSETRON HCL 4 MG/2ML IJ SOLN
INTRAMUSCULAR | Status: AC
  Filled 2020-05-13: qty 2

## 2020-05-13 MED ORDER — MEPERIDINE HCL 25 MG/ML IJ SOLN: 6.2500 mg | INTRAMUSCULAR | Status: DC | PRN

## 2020-05-13 MED ORDER — CEFAZOLIN SODIUM-DEXTROSE 2-3 GM-%(50ML) IV SOLR
INTRAVENOUS | Status: DC | PRN
  Administered 2020-05-13: 2 g via INTRAVENOUS

## 2020-05-13 MED ORDER — DIPHENHYDRAMINE HCL 50 MG/ML IJ SOLN: 12.5000 mg | INTRAMUSCULAR | Status: DC | PRN

## 2020-05-13 MED ORDER — FERROUS SULFATE 325 (65 FE) MG PO TABS
325.0000 mg | ORAL_TABLET | Freq: Two times a day (BID) | ORAL | Status: DC
  Administered 2020-05-13 – 2020-05-16 (×6): 325 mg via ORAL
  Filled 2020-05-13 (×6): qty 1

## 2020-05-13 MED ORDER — METHYLERGONOVINE MALEATE 0.2 MG/ML IJ SOLN: 0.2000 mg | INTRAMUSCULAR | Status: DC | PRN

## 2020-05-13 MED ORDER — ONDANSETRON HCL 4 MG/2ML IJ SOLN
INTRAMUSCULAR | Status: DC | PRN
  Administered 2020-05-13: 4 mg via INTRAVENOUS

## 2020-05-13 MED ORDER — OXYTOCIN-SODIUM CHLORIDE 30-0.9 UT/500ML-% IV SOLN
INTRAVENOUS | Status: DC | PRN
  Administered 2020-05-13 (×4): 150 mL via INTRAVENOUS

## 2020-05-13 MED ORDER — DEXAMETHASONE SODIUM PHOSPHATE 4 MG/ML IJ SOLN
INTRAMUSCULAR | Status: AC
  Filled 2020-05-13: qty 1

## 2020-05-13 MED ORDER — HYDROMORPHONE HCL 1 MG/ML IJ SOLN: 0.2000 mg | INTRAMUSCULAR | Status: DC | PRN

## 2020-05-13 MED ORDER — MORPHINE SULFATE (PF) 0.5 MG/ML IJ SOLN
INTRAMUSCULAR | Status: AC
  Filled 2020-05-13: qty 10

## 2020-05-13 MED ORDER — OXYCODONE HCL 5 MG PO TABS
5.0000 mg | ORAL_TABLET | ORAL | Status: DC | PRN
  Administered 2020-05-15 – 2020-05-16 (×2): 5 mg via ORAL
  Filled 2020-05-13 (×2): qty 1
  Filled 2020-05-13: qty 2

## 2020-05-13 MED ORDER — SODIUM CHLORIDE 0.9% FLUSH: 3.0000 mL | INTRAVENOUS | Status: DC | PRN

## 2020-05-13 MED ORDER — ACETAMINOPHEN 10 MG/ML IV SOLN
INTRAVENOUS | Status: AC
  Filled 2020-05-13: qty 100

## 2020-05-13 MED ORDER — SCOPOLAMINE 1 MG/3DAYS TD PT72
1.0000 | MEDICATED_PATCH | Freq: Once | TRANSDERMAL | Status: AC
  Administered 2020-05-13: 1 via TRANSDERMAL

## 2020-05-13 MED ORDER — ACETAMINOPHEN 500 MG PO TABS
1000.0000 mg | ORAL_TABLET | Freq: Four times a day (QID) | ORAL | Status: AC
  Administered 2020-05-13 – 2020-05-14 (×3): 1000 mg via ORAL
  Filled 2020-05-13 (×3): qty 2

## 2020-05-13 MED ORDER — KETOROLAC TROMETHAMINE 30 MG/ML IJ SOLN
30.0000 mg | Freq: Four times a day (QID) | INTRAMUSCULAR | Status: DC | PRN
  Administered 2020-05-13: 30 mg via INTRAMUSCULAR

## 2020-05-13 MED ORDER — DEXAMETHASONE SODIUM PHOSPHATE 4 MG/ML IJ SOLN
INTRAMUSCULAR | Status: DC | PRN
  Administered 2020-05-13: 5 mg via INTRAVENOUS

## 2020-05-13 MED ORDER — SENNOSIDES-DOCUSATE SODIUM 8.6-50 MG PO TABS
2.0000 | ORAL_TABLET | Freq: Every day | ORAL | Status: DC
  Administered 2020-05-14 – 2020-05-16 (×3): 2 via ORAL
  Filled 2020-05-13 (×3): qty 2

## 2020-05-13 SURGICAL SUPPLY — 34 items
BARRIER ADHS 3X4 INTERCEED (GAUZE/BANDAGES/DRESSINGS) ×4 IMPLANT
BENZOIN TINCTURE PRP APPL 2/3 (GAUZE/BANDAGES/DRESSINGS) ×2 IMPLANT
CHLORAPREP W/TINT 26ML (MISCELLANEOUS) ×2 IMPLANT
CLAMP CORD UMBIL (MISCELLANEOUS) IMPLANT
CLOSURE STERI STRIP 1/2 X4 (GAUZE/BANDAGES/DRESSINGS) ×2 IMPLANT
CLOTH BEACON ORANGE TIMEOUT ST (SAFETY) ×2 IMPLANT
DRSG OPSITE POSTOP 4X10 (GAUZE/BANDAGES/DRESSINGS) ×2 IMPLANT
ELECT REM PT RETURN 9FT ADLT (ELECTROSURGICAL) ×2
ELECTRODE REM PT RTRN 9FT ADLT (ELECTROSURGICAL) ×1 IMPLANT
EXTRACTOR VACUUM KIWI (MISCELLANEOUS) IMPLANT
GLOVE BIOGEL M 7.0 STRL (GLOVE) ×4 IMPLANT
GLOVE BIOGEL PI IND STRL 7.0 (GLOVE) ×3 IMPLANT
GLOVE BIOGEL PI INDICATOR 7.0 (GLOVE) ×3
GOWN STRL REUS W/TWL LRG LVL3 (GOWN DISPOSABLE) ×6 IMPLANT
KIT ABG SYR 3ML LUER SLIP (SYRINGE) IMPLANT
NEEDLE HYPO 25X5/8 SAFETYGLIDE (NEEDLE) IMPLANT
NS IRRIG 1000ML POUR BTL (IV SOLUTION) ×2 IMPLANT
PACK C SECTION WH (CUSTOM PROCEDURE TRAY) ×2 IMPLANT
PAD OB MATERNITY 4.3X12.25 (PERSONAL CARE ITEMS) ×2 IMPLANT
PENCIL SMOKE EVAC W/HOLSTER (ELECTROSURGICAL) ×2 IMPLANT
RTRCTR C-SECT PINK 25CM LRG (MISCELLANEOUS) IMPLANT
STRIP CLOSURE SKIN 1/2X4 (GAUZE/BANDAGES/DRESSINGS) ×2 IMPLANT
SUT PDS AB 0 CT1 27 (SUTURE) ×4 IMPLANT
SUT PLAIN 0 NONE (SUTURE) IMPLANT
SUT VIC AB 0 CTX 36 (SUTURE) ×3
SUT VIC AB 0 CTX36XBRD ANBCTRL (SUTURE) ×3 IMPLANT
SUT VIC AB 2-0 CT1 27 (SUTURE) ×1
SUT VIC AB 2-0 CT1 TAPERPNT 27 (SUTURE) ×1 IMPLANT
SUT VIC AB 3-0 SH 27 (SUTURE)
SUT VIC AB 3-0 SH 27X BRD (SUTURE) IMPLANT
SUT VIC AB 4-0 KS 27 (SUTURE) ×2 IMPLANT
TOWEL OR 17X24 6PK STRL BLUE (TOWEL DISPOSABLE) ×2 IMPLANT
TRAY FOLEY W/BAG SLVR 14FR LF (SET/KITS/TRAYS/PACK) ×2 IMPLANT
WATER STERILE IRR 1000ML POUR (IV SOLUTION) ×2 IMPLANT

## 2020-05-13 NOTE — Transfer of Care (Signed)
Immediate Anesthesia Transfer of Care Note  Patient: Tiffany Hoffman  Procedure(s) Performed: CESAREAN SECTION. (N/A ) APPLICATION OF CELL SAVER (N/A )  Patient Location: PACU  Anesthesia Type:Spinal  Level of Consciousness: awake, alert  and oriented  Airway & Oxygen Therapy: Patient Spontanous Breathing and Patient connected to nasal cannula oxygen  Post-op Assessment: Report given to RN and Post -op Vital signs reviewed and stable  Post vital signs: Reviewed and stable  Last Vitals:  Vitals Value Taken Time  BP 122/68 05/13/20 0934  Temp    Pulse 68 05/13/20 0940  Resp 12 05/13/20 0940  SpO2 100 % 05/13/20 0940  Vitals shown include unvalidated device data.  Last Pain:  Vitals:   05/13/20 0723  TempSrc: Axillary  PainSc:       Patients Stated Pain Goal: 3 (35/59/74 1638)  Complications: No complications documented.

## 2020-05-13 NOTE — Op Note (Signed)
Cesarean Section Procedure Note  Indications: vasa previa and placenta previa   Pre-operative Diagnosis: 35 week 2 day pregnancy.  Post-operative Diagnosis: same  Surgeon: Christophe Louis M.D.  Assistants: Lucinda Dell DO assisted due to the complexity of the anatomy and concern for pelvic adhesive disease   Anesthesia: Spinal anesthesia  ASA Class: 2   Procedure Details   The patient was seen in the Holding Room. The risks, benefits, complications, treatment options, and expected outcomes were discussed with the patient.  The patient concurred with the proposed plan, giving informed consent.  The site of surgery properly noted/marked. The patient was taken to Operating Room # B identified as Tiffany Hoffman and the procedure verified as C-Section Delivery. A Time Out was held and the above information confirmed.  After induction of anesthesia, the patient was draped and prepped in the usual sterile manner. A Pfannenstiel incision was made and carried down through the subcutaneous tissue to the fascia. Fascial incision was made and extended transversely. The fascia was separated from the underlying rectus tissue superiorly and inferiorly. The peritoneum was identified and entered. Peritoneal incision was extended longitudinally. The utero-vesical peritoneal reflection was incised transversely and the bladder flap was bluntly freed from the lower uterine segment. A low transverse uterine incision was made. Delivered from cephalic presentation was a 2195 gram Female with Apgar scores of 7 at one minute and 8 at five minutes. After the umbilical cord was clamped and cut cord blood was obtained for evaluation. The placenta was removed intact and appeared normal. The uterine outline, tubes and ovaries appeared normal. The uterine incision was closed with running locked sutures of 0 vicryl. A second layer of 0 monocryl was used to imbricate the incision. . Hemostasis was observed. Lavage was carried out  until clear. The fascia was then reapproximated with running sutures of 0 pds. The subcuatneous layer was reapproximated with 2-0 vicryl. . The skin was reapproximated with 4-0 vicryl .  Instrument, sponge, and needle counts were correct prior the abdominal closure and at the conclusion of the case.   Findings:  Multiple fibroid uterus. Large Anterior fibroid 6 cm in size located mid uterus. Normal appearing fallopian tubes and ovaries. Adhesion of the omentum to the anterior fibroid above.   Estimated Blood Loss:  969 mL         Drains: None Total IV Fluids:   Per anesthesia ml         Specimens: Placenta and Disposition:  Sent to Pathology          Implants: None         Complications:  None; patient tolerated the procedure well.         Disposition: PACU - hemodynamically stable.         Condition: stable  Attending Attestation: I performed the procedure.

## 2020-05-13 NOTE — Anesthesia Procedure Notes (Signed)
Spinal  Patient location during procedure: OR Start time: 05/13/2020 7:41 AM End time: 05/13/2020 7:46 AM Staffing Performed: anesthesiologist  Anesthesiologist: Merlinda Frederick, MD Preanesthetic Checklist Completed: patient identified, IV checked, risks and benefits discussed, surgical consent, monitors and equipment checked, pre-op evaluation and timeout performed Spinal Block Patient position: sitting Prep: DuraPrep Patient monitoring: cardiac monitor, continuous pulse ox and blood pressure Approach: midline Location: L3-4 Injection technique: single-shot Needle Needle type: Pencan  Needle gauge: 24 G Needle length: 9 cm Assessment Sensory level: T4 Additional Notes Functioning IV was confirmed and monitors were applied. Sterile prep and drape, including hand hygiene and sterile gloves were used. The patient was positioned and the spine was prepped. The skin was anesthetized with lidocaine.  Free flow of clear CSF was obtained prior to injecting local anesthetic into the CSF.  The spinal needle aspirated freely following injection.  The needle was carefully withdrawn.  The patient tolerated the procedure well.

## 2020-05-13 NOTE — Anesthesia Postprocedure Evaluation (Signed)
Anesthesia Post Note  Patient: Tiffany Hoffman  Procedure(s) Performed: CESAREAN SECTION. (N/A ) APPLICATION OF CELL SAVER (N/A )     Patient location during evaluation: Mother Baby Anesthesia Type: Spinal Level of consciousness: oriented and awake and alert Pain management: pain level controlled Vital Signs Assessment: post-procedure vital signs reviewed and stable Respiratory status: spontaneous breathing and respiratory function stable Cardiovascular status: blood pressure returned to baseline and stable Postop Assessment: no headache, no backache, no apparent nausea or vomiting and spinal receding Anesthetic complications: no   No complications documented.  Last Vitals:  Vitals:   05/13/20 0954 05/13/20 1001  BP:  115/66  Pulse: 70 74  Resp: (!) 22 16  Temp:    SpO2: 100% 99%    Last Pain:  Vitals:   05/13/20 1001  TempSrc:   PainSc: 0-No pain   Pain Goal: Patients Stated Pain Goal: 3 (05/12/20 1936)  LLE Motor Response: Non-purposeful movement (05/13/20 1001)   RLE Motor Response: Non-purposeful movement (05/13/20 1001)       Epidural/Spinal Function Cutaneous sensation: No Sensation (05/13/20 0954), Patient able to flex knees: No (05/13/20 0954), Patient able to lift hips off bed: No (05/13/20 0954), Back pain beyond tenderness at insertion site: No (05/13/20 0954), Progressively worsening motor and/or sensory loss: No (05/13/20 0954)  Merlinda Frederick

## 2020-05-13 NOTE — H&P (Signed)
Date of Initial H&P: 05/08/2020 History reviewed, patient examined, no change in status, stable for surgery.

## 2020-05-13 NOTE — Anesthesia Preprocedure Evaluation (Addendum)
Anesthesia Evaluation  Patient identified by MRN, date of birth, ID band Patient awake    Reviewed: Allergy & Precautions, H&P , NPO status , Patient's Chart, lab work & pertinent test results  Airway Mallampati: II  TM Distance: >3 FB Neck ROM: Full    Dental no notable dental hx.    Pulmonary neg pulmonary ROS,    Pulmonary exam normal breath sounds clear to auscultation       Cardiovascular negative cardio ROS Normal cardiovascular exam Rhythm:Regular Rate:Normal     Neuro/Psych negative neurological ROS  negative psych ROS   GI/Hepatic negative GI ROS, Neg liver ROS,   Endo/Other  negative endocrine ROS  Renal/GU negative Renal ROS  negative genitourinary   Musculoskeletal negative musculoskeletal ROS (+)   Abdominal   Peds negative pediatric ROS (+)  Hematology  (+) anemia ,   Anesthesia Other Findings   Reproductive/Obstetrics (+) Pregnancy previa                             Anesthesia Physical Anesthesia Plan  ASA: III  Anesthesia Plan: Spinal   Post-op Pain Management:    Induction:   PONV Risk Score and Plan: 2 and Dexamethasone and Ondansetron  Airway Management Planned: Natural Airway  Additional Equipment:   Intra-op Plan:   Post-operative Plan:   Informed Consent: I have reviewed the patients History and Physical, chart, labs and discussed the procedure including the risks, benefits and alternatives for the proposed anesthesia with the patient or authorized representative who has indicated his/her understanding and acceptance.       Plan Discussed with: CRNA, Anesthesiologist and Surgeon  Anesthesia Plan Comments:         Anesthesia Quick Evaluation

## 2020-05-13 NOTE — Lactation Note (Signed)
This note was copied from a baby's chart. Lactation Consultation Note  Patient Name: Tiffany Hoffman MIWOE'H Date: 05/13/2020 Reason for consult: Initial assessment;Primapara;Late-preterm 34-36.6wks;Infant < 6lbs;Other (Comment) (questionable down syndrome) Age:41 hours  [name] Tiffany Hoffman born at 35 weeks and 33 days, less than 5 pounds. Mom reports no breast surgeries and positive breast changes. Assisted parents with hand expressing and spoon feeding. Attempted to get Tiffany Hoffman to suck on LC gloved finger but she would not.  She took 10 ml of expressed mothers milk via spoon.. Reviewed and left LPTI green sheet. Assisted with breastfeeding.After a few attempts, Tiffany Hoffman  latched easily to moms left  breast.  Fed close to 30 minutes.  Infant with rhythmic sucking and intermittent swallows.  Initiated using DEBP with mom.  Left Tiffany Hoffman Hoffman with mom.  Showed dad how to leave [name] there and assist mom with pumping.   Urged parents to feed on cue and every three hours if she did not cue.  Urged mom to pump every three hours.  Discussed with parents that at some point supplementation may be necessary.  Discussed donor milk supplementation with parents.  Mom reports her sister in law is breastfeeding and if she has to have a supplement would rather her have her milk. Urged her to talk to her sister in law regarding this.   Praised breastmilk feeding.  Urged mom to call lactation as needed. Tiffany Hoffman 05/13/2020, 4:13 PM

## 2020-05-13 NOTE — Addendum Note (Signed)
Addendum  created 05/13/20 1036 by Bufford Spikes, CRNA   Intraprocedure Meds edited

## 2020-05-14 LAB — CBC
HCT: 30.5 % — ABNORMAL LOW (ref 36.0–46.0)
Hemoglobin: 10.1 g/dL — ABNORMAL LOW (ref 12.0–15.0)
MCH: 31.9 pg (ref 26.0–34.0)
MCHC: 33.1 g/dL (ref 30.0–36.0)
MCV: 96.2 fL (ref 80.0–100.0)
Platelets: 212 10*3/uL (ref 150–400)
RBC: 3.17 MIL/uL — ABNORMAL LOW (ref 3.87–5.11)
RDW: 12.7 % (ref 11.5–15.5)
WBC: 16 10*3/uL — ABNORMAL HIGH (ref 4.0–10.5)
nRBC: 0 % (ref 0.0–0.2)

## 2020-05-14 NOTE — Lactation Note (Signed)
This note was copied from a baby's chart. Lactation Consultation Note  Patient Name: [name] [id number] Date: 05/14/2020 Reason for consult: Follow-up assessment;Primapara;1st time breastfeeding;Late-preterm 34-36.6wks;Infant < 6lbs Age:41 hours  1333 - 1403 - I followed up with Ms. Tiffany Hoffman. Speech therapy entered the room shortly after me. I assisted Ms. Tiffany Hoffman with pumping while ST worked with Psychiatric nurse to initiate bottle feeding. Tiffany Hoffman last fed shortly prior to my entry for about 4 minutes. Ms. Tiffany Hoffman states that baby can latch, but she does fall asleep quickly.  We discussed LPI status, and Ms. Tiffany Hoffman is agreeable to supplementation. She prefers to use formula at this time.   While Speech bottle fed baby, I created a pumping bra with a belly band and reviewed pumping methods.  Plan: Breast feed on demand 8-12 times a day. Wake baby to feed as needed (see Speech therapy note regarding best practices for bottle feeding). Offer breast prior to supplementation.  After breast feeding, supplement as per LPI guidelines.  Post - pump. Feed any EBM back to baby.  Ms. Tiffany Hoffman mother was in the room and assisting with components of this visit. I encouraged Ms. Tiffany Hoffman to call lactation this evening for latch support. She verbalized understanding.   Maternal Data Does the patient have breastfeeding experience prior to this delivery?: No  Feeding Feeding Type: Breast Fed  Interventions Interventions: Breast feeding basics reviewed;DEBP  Lactation Tools Discussed/Used Tools: Pump;Other (comment) (belly band) Breast pump type: Double-Electric Breast Pump Pump Education: Setup, frequency, and cleaning   Consult Status Consult Status: Follow-up Date: 05/15/20 Follow-up type: In-patient    Lenore Manner 05/14/2020, 2:07 PM

## 2020-05-14 NOTE — Progress Notes (Signed)
Subjective: Postpartum Day 1: Cesarean Delivery Patient reports incisional pain, tolerating PO and no problems voiding.    Objective: Vital signs in last 24 hours: Temp:  [98.4 F (36.9 C)-98.5 F (36.9 C)] 98.4 F (36.9 C) (01/12 1300) Pulse Rate:  [73-82] 73 (01/12 1300) Resp:  [16-19] 16 (01/12 1300) BP: (108-125)/(64-74) 125/73 (01/12 1300) SpO2:  [99 %-100 %] 100 % (01/12 1300)  Physical Exam:  General: alert, cooperative and no distress Lochia: appropriate Uterine Fundus: firm Incision: honeycomb bandage in place and intact  DVT Evaluation: No evidence of DVT seen on physical exam.  Recent Labs    05/13/20 0452 05/14/20 0632  HGB 11.4* 10.1*  HCT 32.7* 30.5*    Assessment/Plan: Status post Cesarean section. Doing well postoperatively.  Continue current care Encouraged ambulation Pt is interested in discharge home tomorrow however she is advised to speak with the pediatrician regarding discharge of newborn given prematurity. Christophe Louis 05/14/2020, 4:00 PM

## 2020-05-14 NOTE — Lactation Note (Addendum)
This note was copied from a baby's chart. Lactation Consultation Note Baby 41 hrs old.  Mom stated the baby has been BF well. Asked mom if she is pumping. Mom stated she didn't pump during the night because of she hasn't had time. Encouraged mom to pump for extra stimulation and possible supplementation for the baby. Explained to mom that the baby is small and 35 weeks and will need to pump and supplement for about 5 weeks.  Mom stated she is going to ask her sister-in-law for some BM. Mom told LC that she is probably going to need some formula since the baby will need to have so much so often and sister-in-law has twins and may not be able to provide all that she needs. [place] agreed. Discussed w/mom how the baby being 35 weeks needs to receive supplement in a bottle to learn to suck w/a nipple to help develop her suck swallow motions. Mom stated understanding and is fine w/that. Mom would like to wait on the formula until she finds out if she can get sister-in-laws milk first. Asked mom to let staff know if she needs the formula. Reminded of hospitals Donor milk. Mom stated she would do formula.  Asked mom if she would like to pump mom stated OK. So mom pumping as we talked. It wasn't time for baby to feed and baby wasn't cueing to feed. LC praised mom for her hard work.  Encouraged to call today for LC to see latch.   Patient Name: Tiffany Hoffman [id number] Date: 05/14/2020 Reason for consult: Follow-up assessment;Late-preterm 34-36.6wks;Infant < 6lbs;Primapara Age:12 hours  Maternal Data Has patient been taught Hand Expression?: Yes Does the patient have breastfeeding experience prior to this delivery?: No  Feeding    LATCH Score                   Interventions Interventions: DEBP  Lactation Tools Discussed/Used Tools: Pump Breast pump type: Double-Electric Breast Pump   Consult Status Consult Status: Follow-up Date: 05/14/20 Follow-up type:  In-patient    Theodoro Kalata 05/14/2020, 5:26 AM

## 2020-05-15 ENCOUNTER — Other Ambulatory Visit: Payer: Self-pay

## 2020-05-15 LAB — TYPE AND SCREEN
ABO/RH(D): B POS
Antibody Screen: NEGATIVE
Unit division: 0
Unit division: 0

## 2020-05-15 LAB — BPAM RBC
Blood Product Expiration Date: 202201292359
Blood Product Expiration Date: 202201292359
Unit Type and Rh: 7300
Unit Type and Rh: 7300

## 2020-05-15 MED ORDER — ACETAMINOPHEN 500 MG PO TABS
1000.0000 mg | ORAL_TABLET | Freq: Four times a day (QID) | ORAL | Status: DC | PRN
  Administered 2020-05-15 – 2020-05-16 (×5): 1000 mg via ORAL
  Filled 2020-05-15 (×5): qty 2

## 2020-05-15 MED ORDER — OXYCODONE HCL 5 MG PO TABS: 5.0000 mg | ORAL_TABLET | ORAL | 0 refills | Status: AC | PRN

## 2020-05-15 MED ORDER — IBUPROFEN 800 MG PO TABS: 800.0000 mg | ORAL_TABLET | Freq: Three times a day (TID) | ORAL | 1 refills | Status: AC | PRN

## 2020-05-15 MED FILL — Sodium Chloride IV Soln 0.9%: INTRAVENOUS | Qty: 1000 | Status: AC

## 2020-05-15 MED FILL — Heparin Sodium (Porcine) Inj 1000 Unit/ML: INTRAMUSCULAR | Qty: 30 | Status: AC

## 2020-05-15 NOTE — Progress Notes (Signed)
Subjective: Postpartum Day 2: Cesarean Delivery Patient reports incisional pain, tolerating PO and no problems voiding.  Reported an episode of chest pain last night that occurred with lifting her arm.  States the same pain occurred during the CS.  The pain has now resolved but it was painful.  Also reports incisional pain and she does not want to move as much.  States she is trying to avoid narcotics.  Has only had one dose of Roxi 5mg .  Lochia:  Minimal  Denies fevers, chills, chest pain, visual changes, SOB, RUQ/epigastric pain, N/V, dysuria, hematuria, or sudden onset/worsening bilateral LE or facial edema.  Objective: Vital signs in last 24 hours: Temp:  [98 F (36.7 C)-98.4 F (36.9 C)] 98 F (36.7 C) (01/12 1957) Pulse Rate:  [73-97] 97 (01/13 0000) Resp:  [15-16] 16 (01/13 0000) BP: (124-130)/(64-73) 130/70 (01/13 0000) SpO2:  [100 %] 100 % (01/13 0000)  Physical Exam:  General: alert, cooperative and no distress  Cardio:  RRR, systolic ejection murmur present Lungs:  CTAB, no wheezes/rales/rhonchi Uterine Fundus: firm Incision: honeycomb bandage in place and intact  DVT Evaluation: trace bilateral LE edema, no bilateral calf tenderness  Recent Labs    05/13/20 0452 05/14/20 0632  HGB 11.4* 10.1*  HCT 32.7* 30.5*    Assessment/Plan:  S/p LTCS Continue routine postpartum care Encouraged ambulation and taking narcotic pain medication for adequate pain control  Chest pain - Suspect MSK - EKG ordered, awaiting read - Systolic ejection murmur auscultated, however, normal and expected due to recent pregnancy and postpartum  Disposition:  D/C home POD#3 - recommend continued inpatient management until adequate pain control and ambulation  Drema Dallas 05/15/2020, 6:04 AM

## 2020-05-16 LAB — SURGICAL PATHOLOGY

## 2020-05-16 NOTE — Discharge Summary (Signed)
Postpartum Discharge Summary  Date of Service updated 05/16/2020     Patient Name: Tiffany Hoffman DOB: 1980/11/03 MRN: [id number]  Date of admission: 05/08/2020 Delivery date:05/13/2020  Delivering provider: Bess Harvest  Date of discharge: 05/16/2020  Admitting diagnosis: Vasa previa [O69.4XX0] Placenta previa antepartum [O44.00] Intrauterine pregnancy: [redacted]w[redacted]d    Secondary diagnosis:  Active Problems:   Vasa previa   Placenta previa antepartum  Additional problems: Fibroid uterus     Discharge diagnosis: Preterm Pregnancy Delivered                                              Post partum procedures:None Augmentation: N/A Complications: None  Hospital course: Induction of Labor With Cesarean Section   40y.o. yo G1P0101 at 344w3das admitted to the hospital 05/08/2020 for I inpatient monitoring due to vasa previa.  She wen for cesarean section due to vasa previa  . Delivery details are as follows: Membrane Rupture Time/Date:  ,05/13/2020   Delivery Method:C-Section, Low Transverse  Details of operation can be found in separate operative Note.  Patient had an uncomplicated postpartum course. She is ambulating, tolerating a regular diet, passing flatus, and urinating well.  Patient is discharged home in stable condition on 05/16/20.      Newborn Data: Birth date:05/13/2020  Birth time:8:22 AM  Gender:Female  Living status:Living  Apgars:7 ,8  [phone]                                 Magnesium Sulfate received: No BMZ received: Yes Rhophylac:N/A MMR:N/A T-DaP:Given prenatally Flu: N/A Transfusion:Yes  Physical exam  Vitals:   05/15/20 0600 05/15/20 1553 05/15/20 2234 05/16/20 0549  BP: 128/72 126/76 135/78 128/90  Pulse: 82  88 76  Resp: _0 Temp: 98.1 F (36.7 C) 98 F (36.7 C) 98.2 F (36.8 C) 98.4 F (36.9 C)  TempSrc: Oral Oral Oral Oral  SpO2: 100%  98% 100%  Weight:      Height:       General: alert, cooperative and no distress Lochia:  appropriate Uterine Fundus: firm Incision: Healing well with no significant drainage DVT Evaluation: No evidence of DVT seen on physical exam. Labs: Lab Results  Component Value Date   WBC 16.0 (H) 05/14/2020   HGB 10.1 (L) 05/14/2020   HCT 30.5 (L) 05/14/2020   MCV 96.2 05/14/2020   PLT 212 05/14/2020   CMP Latest Ref Rng & Units 02/11/2020  Glucose 70 - 99 mg/dL 102(H)  BUN 6 - 20 mg/dL 12  Creatinine 0.44 - 1.00 mg/dL 0.66  Sodium 135 - 145 mmol/L 135  Potassium 3.5 - 5.1 mmol/L 3.8  Chloride 98 - 111 mmol/L 103  CO2 22 - 32 mmol/L 20(L)  Calcium 8.9 - 10.3 mg/dL 9.7  Total Protein 6.5 - 8.1 g/dL 7.3  Total Bilirubin 0.3 - 1.2 mg/dL 0.5  Alkaline Phos 38 - 126 U/L 48  AST 15 - 41 U/L 17  ALT 0 - 44 U/L 15   Edinburgh Score: Edinburgh Postnatal Depression Scale Screening Tool 05/14/2020  I have been able to laugh and see the funny side of things. 0  I have looked forward with enjoyment to things. 0  I have blamed myself unnecessarily when things went wrong. 0  I  have been anxious or worried for no good reason. 0  I have felt scared or panicky for no good reason. 0  Things have been getting on top of me. 0  I have been so unhappy that I have had difficulty sleeping. 0  I have felt sad or miserable. 0  I have been so unhappy that I have been crying. 0  The thought of harming myself has occurred to me. 0  Edinburgh Postnatal Depression Scale Total 0      After visit meds:  Allergies as of 05/16/2020      Reactions   Latex Itching      Medication List    STOP taking these medications   cephALEXin 500 MG capsule Commonly known as: KEFLEX   oxyCODONE-acetaminophen 5-325 MG tablet Commonly known as: Percocet   progesterone 200 MG Supp     TAKE these medications   ibuprofen 800 MG tablet Commonly known as: ADVIL Take 1 tablet (800 mg total) by mouth every 8 (eight) hours as needed.   oxyCODONE 5 MG immediate release tablet Commonly known as: Oxy  IR/ROXICODONE Take 1-2 tablets (5-10 mg total) by mouth every 4 (four) hours as needed for up to 7 days for moderate pain.   PRENATAL VITAMINS PO Take by mouth.        Discharge home in stable condition Infant Feeding: Bottle and Breast Infant Disposition:home with mother Discharge instruction: per After Visit Summary and Postpartum booklet. Activity: Advance as tolerated. Pelvic rest for 6 weeks.  Diet: routine diet Anticipated Birth Control: Unsure Postpartum Appointment:2 weeks Additional Postpartum F/U: Incision check 2 weeks  Future Appointments:No future appointments. Follow up Visit:  Follow-up Information    Christophe Louis, MD Follow up in 2 week(s).   Specialty: Obstetrics and Gynecology Why: 2 week wound check and 6 week postpartum visit. Contact information: [address] [place] [address] [phone]                   05/16/2020 Christophe Louis, MD

## 2020-05-16 NOTE — Discharge Instructions (Signed)

## 2020-06-04 ENCOUNTER — Other Ambulatory Visit: Payer: Self-pay

## 2020-06-04 ENCOUNTER — Encounter (HOSPITAL_COMMUNITY): Payer: Self-pay | Admitting: Obstetrics and Gynecology

## 2020-06-04 ENCOUNTER — Observation Stay (HOSPITAL_COMMUNITY)
Admission: AD | Admit: 2020-06-04 | Discharge: 2020-06-05 | Disposition: A | Discharge status: Home / Self Care | Payer: 59 | Attending: Obstetrics and Gynecology | Admitting: Obstetrics and Gynecology

## 2020-06-04 ENCOUNTER — Inpatient Hospital Stay (HOSPITAL_COMMUNITY): Payer: 59

## 2020-06-04 DIAGNOSIS — U071 COVID-19: Secondary | ICD-10-CM | POA: Insufficient documentation

## 2020-06-04 DIAGNOSIS — N800 Endometriosis of uterus: Secondary | ICD-10-CM | POA: Insufficient documentation

## 2020-06-04 DIAGNOSIS — Z7982 Long term (current) use of aspirin: Secondary | ICD-10-CM | POA: Diagnosis not present

## 2020-06-04 DIAGNOSIS — D259 Leiomyoma of uterus, unspecified: Secondary | ICD-10-CM | POA: Insufficient documentation

## 2020-06-04 DIAGNOSIS — O09511 Supervision of elderly primigravida, first trimester: Secondary | ICD-10-CM | POA: Insufficient documentation

## 2020-06-04 DIAGNOSIS — O8612 Endometritis following delivery: Secondary | ICD-10-CM | POA: Diagnosis present

## 2020-06-04 DIAGNOSIS — N719 Inflammatory disease of uterus, unspecified: Secondary | ICD-10-CM

## 2020-06-04 DIAGNOSIS — O900 Disruption of cesarean delivery wound: Secondary | ICD-10-CM | POA: Diagnosis present

## 2020-06-04 LAB — URINALYSIS, ROUTINE W REFLEX MICROSCOPIC
Bilirubin Urine: NEGATIVE
Glucose, UA: NEGATIVE mg/dL
Ketones, ur: NEGATIVE mg/dL
Nitrite: NEGATIVE
Protein, ur: 30 mg/dL — AB
RBC / HPF: >50 RBC/hpf — ABNORMAL HIGH (ref 0–5)
Specific Gravity, Urine: 1.01 (ref 1.005–1.030)
pH: 7 (ref 5.0–8.0)

## 2020-06-04 LAB — CBC WITH DIFFERENTIAL/PLATELET
Abs Immature Granulocytes: 0.01 10*3/uL (ref 0.00–0.07)
Basophils Absolute: 0 10*3/uL (ref 0.0–0.1)
Basophils Relative: 0 %
Eosinophils Absolute: 0 10*3/uL (ref 0.0–0.5)
Eosinophils Relative: 0 %
HCT: 35.9 % — ABNORMAL LOW (ref 36.0–46.0)
Hemoglobin: 11.8 g/dL — ABNORMAL LOW (ref 12.0–15.0)
Immature Granulocytes: 0 %
Lymphocytes Relative: 25 %
Lymphs Abs: 1.2 10*3/uL (ref 0.7–4.0)
MCH: 31 pg (ref 26.0–34.0)
MCHC: 32.9 g/dL (ref 30.0–36.0)
MCV: 94.2 fL (ref 80.0–100.0)
Monocytes Absolute: 0.4 10*3/uL (ref 0.1–1.0)
Monocytes Relative: 7 %
Neutro Abs: 3.1 10*3/uL (ref 1.7–7.7)
Neutrophils Relative %: 68 %
Platelets: 376 10*3/uL (ref 150–400)
RBC: 3.81 MIL/uL — ABNORMAL LOW (ref 3.87–5.11)
RDW: 13.5 % (ref 11.5–15.5)
WBC: 4.7 10*3/uL (ref 4.0–10.5)
nRBC: 0 % (ref 0.0–0.2)

## 2020-06-04 MED ORDER — PRENATAL MULTIVITAMIN CH
1.0000 | ORAL_TABLET | Freq: Every day | ORAL | Status: DC
  Administered 2020-06-05: 1 via ORAL
  Filled 2020-06-04: qty 1

## 2020-06-04 MED ORDER — IBUPROFEN 800 MG PO TABS
800.0000 mg | ORAL_TABLET | Freq: Three times a day (TID) | ORAL | Status: DC | PRN
  Administered 2020-06-04 – 2020-06-05 (×3): 800 mg via ORAL
  Filled 2020-06-04 (×3): qty 1

## 2020-06-04 MED ORDER — CLINDAMYCIN PHOSPHATE 900 MG/50ML IV SOLN
900.0000 mg | Freq: Three times a day (TID) | INTRAVENOUS | Status: DC
  Administered 2020-06-04: 900 mg via INTRAVENOUS
  Filled 2020-06-04: qty 50

## 2020-06-04 MED ORDER — SODIUM CHLORIDE 0.9 % IV SOLN
2.0000 g | Freq: Four times a day (QID) | INTRAVENOUS | Status: AC
  Administered 2020-06-04 – 2020-06-05 (×4): 2 g via INTRAVENOUS
  Filled 2020-06-04 (×4): qty 2000

## 2020-06-04 MED ORDER — SODIUM CHLORIDE 0.9% FLUSH: 3.0000 mL | Freq: Two times a day (BID) | INTRAVENOUS | Status: DC

## 2020-06-04 MED ORDER — CLINDAMYCIN PHOSPHATE 900 MG/50ML IV SOLN
900.0000 mg | Freq: Three times a day (TID) | INTRAVENOUS | Status: AC
  Administered 2020-06-04 – 2020-06-05 (×3): 900 mg via INTRAVENOUS
  Filled 2020-06-04 (×3): qty 50

## 2020-06-04 MED ORDER — GENTAMICIN SULFATE 40 MG/ML IJ SOLN
5.0000 mg/kg | INTRAVENOUS | Status: AC
  Administered 2020-06-04 – 2020-06-05 (×2): 470 mg via INTRAVENOUS
  Filled 2020-06-04 (×2): qty 11.75

## 2020-06-04 MED ORDER — SODIUM CHLORIDE 0.9 % IV SOLN
2.0000 g | Freq: Four times a day (QID) | INTRAVENOUS | Status: DC
  Administered 2020-06-04: 2 g via INTRAVENOUS
  Filled 2020-06-04: qty 2000

## 2020-06-04 MED ORDER — SODIUM CHLORIDE 0.9% FLUSH: 3.0000 mL | INTRAVENOUS | Status: DC | PRN

## 2020-06-04 MED ORDER — SODIUM CHLORIDE 0.9 % IV SOLN: 250.0000 mL | INTRAVENOUS | Status: DC | PRN

## 2020-06-04 NOTE — Progress Notes (Signed)
Pharmacy Antibiotic Note  Tiffany Hoffman is a 40 y.o. female admitted on 06/04/2020 with endometritis.  Pharmacy has been consulted for gentamicin dosing.  Plan: Start gentamicin 5 mg/kg IV q24h. Will continue to follow along and obtain gentamicin level in 2-3 days if continued.    Temp (24hrs), Avg:98 F (36.7 C), Min:98 F (36.7 C), Max:98 F (36.7 C)  No results for input(s): WBC, CREATININE, LATICACIDVEN, VANCOTROUGH, VANCOPEAK, VANCORANDOM, GENTTROUGH, GENTPEAK, GENTRANDOM, TOBRATROUGH, TOBRAPEAK, TOBRARND, AMIKACINPEAK, AMIKACINTROU, AMIKACIN in the last 168 hours.  CrCl cannot be calculated (Patient's most recent lab result is older than the maximum 21 days allowed.).    Allergies  Allergen Reactions  . Latex Itching    Antimicrobials this admission: Ampicillin 2g q6h (2/2>> Clindamycin 900 mg q8h (2/2>>  Thank you for allowing pharmacy to be a part of this patient's care.  Yolanda Bonine, PharmD, MHSA, BCPPS 06/04/2020 2:48 PM

## 2020-06-04 NOTE — H&P (Signed)
Tiffany Hoffman is an 40 y.o. G1P0101 is admitted for presumed postpartum endometritis.  Patient presented today for her 2 week incision check. Pt had a LTCS due to vasa previa on 05/13/2020. Patient states her pain has not been well-controlled.  Has been using Motrin up until the last couple of days. When she was in the shower, she had a large tissue-like substance pass and her bleeding increased.  Prior to this her lochia had decreased to minimal spotting.Reports feeling fatigued.  Denies any fevers at home. She is currently breastfeeding.  Patient Active Problem List   Diagnosis Date Noted  . Leiomyoma of uterus 06/04/2020  . Primigravida of advanced maternal age in first trimester 06/04/2020  . Endometritis 06/04/2020  . Placenta previa antepartum 05/13/2020  . Vasa previa 05/08/2020  . Short cervical length during pregnancy 02/07/2020  . Placenta previa 02/07/2020  . Velamentous insertion of umbilical cord 98/03/9146    MEDICAL/FAMILY/SOCIAL HX: Patient's last menstrual period was 09/08/2019.    Past Medical History:  Diagnosis Date  . Fibroid     Past Surgical History:  Procedure Laterality Date  . CESAREAN SECTION N/A 05/13/2020   Procedure: CESAREAN SECTION.;  Surgeon: Christophe Louis, MD;  Location: [facility];  Service: Obstetrics;  Laterality: N/A;  . NO PAST SURGERIES      Family History  Problem Relation Age of Onset  . Heart disease Mother   . Hypertension Mother   . Diabetes Mother   . Hypertension Father   . Hypertension Sister   . Hypertension Brother     Social History:  reports that she has never smoked. She has never used smokeless tobacco. She reports previous drug use. She reports that she does not drink alcohol.  ALLERGIES/MEDS:  Allergies:  Allergies  Allergen Reactions  . Latex Itching    No medications prior to admission.     Review of Systems  Constitutional: Positive for malaise/fatigue.  Eyes: Negative.   Respiratory: Negative.    Cardiovascular: Negative.   Gastrointestinal: Positive for abdominal pain. Negative for blood in stool, constipation, diarrhea, heartburn, melena, nausea and vomiting.  Genitourinary: Negative.   Musculoskeletal: Negative.   Skin: Negative.   Neurological: Negative.   Endo/Heme/Allergies: Negative.   Psychiatric/Behavioral: Negative.     Last menstrual period 09/08/2019, unknown if currently breastfeeding. Gen:  NAD, mildly uncomfortable-appearing Pulm:  No increased work of breathing Abd:  Soft, mildly distended, fibroid uterus palpable and enlarged, uterus tender throughout, no rebound/guarding Ext:  Trace bilateral LE edema Pelvic:  Normal-appearing external genitalia, vaginal mucosa pink with rugae, cervix normal-appearing and without lesions, scant dark blood in vaginal vault, enlarged fibroid uterus with significant tenderness to palpation  No results found for this or any previous visit (from the past 24 hour(s)).  No results found.   ASSESSMENT/PLAN: Tiffany Hoffman is a 40 y.o. G1P0101 who is admitted for treatment of presumed postpartum endometritis.  Postpartum endometritis - Significant uterine tenderness with malaise and uncontrolled pain  - Admit to [facility] - Admit labs (CBC, COVID screen) - Imaging:  TVUS ordered to rule out retained products - Antibiotics:  Ampicillin/Gent/Clinda ordered for at least 24 hours - Diet:  Regular - IVF:  Carrier fluid protocol - VTE Prophylaxis:  SCDs - Pain control:  Motrin 800mg  q8h  Uterine fibroids - Multiple the majority and largest are posterior - largest 6cm - Possible that uterine tenderness is secondary to degenerating fibroids  Disposition:  Likely D/C home after 24 hours after clinical improvement.  Drema Dallas, DO [phone] (office)

## 2020-06-05 DIAGNOSIS — N800 Endometriosis of uterus: Secondary | ICD-10-CM | POA: Diagnosis not present

## 2020-06-05 LAB — SARS CORONAVIRUS 2 (TAT 6-24 HRS): SARS Coronavirus 2: POSITIVE — AB

## 2020-06-05 MED ORDER — NIFEDIPINE ER OSMOTIC RELEASE 30 MG PO TB24
30.0000 mg | ORAL_TABLET | Freq: Every day | ORAL | Status: DC
  Administered 2020-06-05: 30 mg via ORAL
  Filled 2020-06-05: qty 1

## 2020-06-05 MED ORDER — NIFEDIPINE ER 30 MG PO TB24
30.0000 mg | ORAL_TABLET | Freq: Every day | ORAL | 1 refills | Status: AC
Start: 2020-06-06 — End: ?

## 2020-06-05 NOTE — Lactation Note (Signed)
Lactation Consultation Note  Patient Name: Tiffany Hoffman [id number] Date: 06/05/2020    This patient is 2 weeks pp and breastfeeding. Her baby is with her and breastfeeding. She pumps prn with her own pump. Per RN, she will d/c today. Patient was provided with the opportunity to ask questions. All concerns were addressed.    Gwynne Edinger, MA IBCLC 06/05/2020, 9:36 AM

## 2020-06-05 NOTE — Progress Notes (Signed)
Discharge instructions and prescriptions given to pt. Discussed signs and symptoms to report to the MD, upcoming appointments, and meds. Pt verbalizes understanding and has no questions or concerns at this time. Pt discharged home from hospital in stable condition. 

## 2020-06-05 NOTE — Discharge Summary (Signed)
Physician Discharge Summary  Patient ID: Tiffany Hoffman MRN: [id number] DOB/AGE: [birth year] 40 y.o.  Admit date: 06/04/2020 Discharge date: 06/05/2020  Admission Diagnoses: postpartum endometritis   Discharge Diagnoses:  Active Problems:   Endometritis   Postpartum endometritis   Discharged Condition: stable  Hospital Course:  Pt was admitted for treatment of suspected postpartum endometritis and possible retained products of conception. She had a pelvic ultrasound the was negative for any retained products of conception. Large fibroids noted. She received IV amp/ gent / clinda for 24 hours. Pain did not change with antibiotic treatment. Her pain is most likely not due to endometritis and is more likely due to degenerative fibroids. She dis discharged in stable condition.   Consults: None  Significant Diagnostic Studies: labs: wbc 4.7  Treatments: antibiotics: ampicillin, gentamicin, clindamycin  Discharge Exam: Blood pressure 130/74, pulse 90, temperature 97.9 F (36.6 C), temperature source Oral, resp. rate 16, height 5\' 5"  (1.651 m), weight 94.3 kg, SpO2 100 %, unknown if currently breastfeeding. General appearance: alert, cooperative and no distress GI: soft and tender to moderate palpation no rebound and no guarding  Extremities: extremities normal, atraumatic, no cyanosis or edema Incision/Wound:well approximated no erythema or exudate   Disposition: Discharge disposition: 01-Home or Self Care       Discharge Instructions    Activity as tolerated   Complete by: As directed    Call MD for:  difficulty breathing, headache or visual disturbances   Complete by: As directed    Call MD for:  persistant nausea and vomiting   Complete by: As directed    Call MD for:  severe uncontrolled pain   Complete by: As directed    Call MD for:  temperature >100.4   Complete by: As directed    Diet - low sodium heart healthy   Complete by: As directed    No wound care   Complete by:  As directed    Sexual activity   Complete by: As directed    Avoid sex for 4 weeks     Allergies as of 06/05/2020      Reactions   Latex Itching      Medication List    TAKE these medications   ibuprofen 800 MG tablet Commonly known as: ADVIL Take 1 tablet (800 mg total) by mouth every 8 (eight) hours as needed.   NIFEdipine 30 MG 24 hr tablet Commonly known as: ADALAT CC Take 1 tablet (30 mg total) by mouth daily. Start taking on: June 06, 2020   prenatal multivitamin Tabs tablet Take 1 tablet by mouth daily at 12 noon.       Follow-up Information    Christophe Louis, MD. Schedule an appointment as soon as possible for a visit in 4 week(s).   Specialty: Obstetrics and Gynecology Why: please make an appointment with Dr. Landry Mellow in 4 weeks for postpartum visit  Contact information: [address] [place] [address] [phone]               Signed: Christophe Louis 06/05/2020, 1:30 PM

## 2020-06-11 ENCOUNTER — Inpatient Hospital Stay (HOSPITAL_COMMUNITY): Payer: 59

## 2020-06-11 ENCOUNTER — Inpatient Hospital Stay (HOSPITAL_COMMUNITY)
Admission: AD | Admit: 2020-06-11 | Discharge: 2020-06-11 | Disposition: A | Discharge status: Home / Self Care | Payer: 59 | Attending: Obstetrics and Gynecology | Admitting: Obstetrics and Gynecology

## 2020-06-11 ENCOUNTER — Encounter (HOSPITAL_COMMUNITY): Payer: Self-pay | Admitting: Obstetrics and Gynecology

## 2020-06-11 ENCOUNTER — Other Ambulatory Visit: Payer: Self-pay

## 2020-06-11 DIAGNOSIS — Z91040 Latex allergy status: Secondary | ICD-10-CM | POA: Diagnosis not present

## 2020-06-11 DIAGNOSIS — O9853 Other viral diseases complicating the puerperium: Secondary | ICD-10-CM | POA: Diagnosis not present

## 2020-06-11 DIAGNOSIS — Z79899 Other long term (current) drug therapy: Secondary | ICD-10-CM | POA: Diagnosis not present

## 2020-06-11 DIAGNOSIS — Z8249 Family history of ischemic heart disease and other diseases of the circulatory system: Secondary | ICD-10-CM | POA: Diagnosis not present

## 2020-06-11 DIAGNOSIS — R42 Dizziness and giddiness: Secondary | ICD-10-CM | POA: Diagnosis not present

## 2020-06-11 DIAGNOSIS — U071 COVID-19: Secondary | ICD-10-CM | POA: Diagnosis not present

## 2020-06-11 DIAGNOSIS — R519 Headache, unspecified: Secondary | ICD-10-CM | POA: Insufficient documentation

## 2020-06-11 DIAGNOSIS — R0789 Other chest pain: Secondary | ICD-10-CM | POA: Insufficient documentation

## 2020-06-11 DIAGNOSIS — O99893 Other specified diseases and conditions complicating puerperium: Secondary | ICD-10-CM | POA: Diagnosis not present

## 2020-06-11 DIAGNOSIS — Z791 Long term (current) use of non-steroidal anti-inflammatories (NSAID): Secondary | ICD-10-CM | POA: Diagnosis not present

## 2020-06-11 DIAGNOSIS — R5383 Other fatigue: Secondary | ICD-10-CM | POA: Diagnosis not present

## 2020-06-11 LAB — URINALYSIS, ROUTINE W REFLEX MICROSCOPIC
Bilirubin Urine: NEGATIVE
Glucose, UA: NEGATIVE mg/dL
Hgb urine dipstick: NEGATIVE
Ketones, ur: NEGATIVE mg/dL
Leukocytes,Ua: NEGATIVE
Nitrite: NEGATIVE
Protein, ur: NEGATIVE mg/dL
Specific Gravity, Urine: 1.019 (ref 1.005–1.030)
pH: 5 (ref 5.0–8.0)

## 2020-06-11 LAB — CBC
HCT: 42.8 % (ref 36.0–46.0)
Hemoglobin: 13.5 g/dL (ref 12.0–15.0)
MCH: 29.3 pg (ref 26.0–34.0)
MCHC: 31.5 g/dL (ref 30.0–36.0)
MCV: 92.8 fL (ref 80.0–100.0)
Platelets: 329 10*3/uL (ref 150–400)
RBC: 4.61 MIL/uL (ref 3.87–5.11)
RDW: 13.4 % (ref 11.5–15.5)
WBC: 3.8 10*3/uL — ABNORMAL LOW (ref 4.0–10.5)
nRBC: 0 % (ref 0.0–0.2)

## 2020-06-11 LAB — COMPREHENSIVE METABOLIC PANEL
ALT: 27 U/L (ref 0–44)
AST: 18 U/L (ref 15–41)
Albumin: 3.7 g/dL (ref 3.5–5.0)
Alkaline Phosphatase: 69 U/L (ref 38–126)
Anion gap: 10 (ref 5–15)
BUN: 12 mg/dL (ref 6–20)
CO2: 27 mmol/L (ref 22–32)
Calcium: 9.2 mg/dL (ref 8.9–10.3)
Chloride: 101 mmol/L (ref 98–111)
Creatinine, Ser: 0.88 mg/dL (ref 0.44–1.00)
GFR, Estimated: >60 mL/min (ref >60)
Glucose, Bld: 85 mg/dL (ref 70–99)
Potassium: 4.5 mmol/L (ref 3.5–5.1)
Sodium: 138 mmol/L (ref 135–145)
Total Bilirubin: 0.8 mg/dL (ref 0.3–1.2)
Total Protein: 6.9 g/dL (ref 6.5–8.1)

## 2020-06-11 LAB — BRAIN NATRIURETIC PEPTIDE: B Natriuretic Peptide: 11.9 pg/mL (ref 0.0–100.0)

## 2020-06-11 LAB — TROPONIN I (HIGH SENSITIVITY): Troponin I (High Sensitivity): <2 ng/L (ref <18)

## 2020-06-11 MED ORDER — ACETAMINOPHEN 500 MG PO TABS
1000.0000 mg | ORAL_TABLET | Freq: Once | ORAL | Status: AC
  Administered 2020-06-11: 1000 mg via ORAL
  Filled 2020-06-11: qty 2

## 2020-06-11 NOTE — MAU Provider Note (Signed)
Chief Complaint:  Dizziness   Event Date/Time   First Provider Initiated Contact with Patient 06/11/20 0911     HPI: Tiffany Hoffman is a 40 y.o. G1P0101 at 4wks postpartum who presents to maternity admissions reporting a slight headache, dizziness and light-headedness x2 days. She also reports occasional transiet chest pain that gets better when she takes deep breaths - several episodes within the last two days, worst at night, none at admission but one brief episode during provider exam. Denies heavy vaginal bleeding, abdominal pain, fever, or falls. Has not had much of an appetite this week but feels she has stayed hydrated. Tested Covid+ on 06/04/20 when she presented for degenerative fibroids.  Past Medical History:  Diagnosis Date  . Fibroid    OB History  Gravida Para Term Preterm AB Living  1 1   1   1   SAB IAB Ectopic Multiple Live Births        0 1    # Outcome Date GA Lbr Len/2nd Weight Sex Delivery Anes PTL Lv  1 Preterm 05/13/20 [redacted]w[redacted]d  4 lb 13.4 oz (2.195 kg) F CS-LTranv Spinal  LIV   Past Surgical History:  Procedure Laterality Date  . CESAREAN SECTION N/A 05/13/2020   Procedure: CESAREAN SECTION.;  Surgeon: Christophe Louis, MD;  Location: [facility];  Service: Obstetrics;  Laterality: N/A;  . NO PAST SURGERIES     Family History  Problem Relation Age of Onset  . Heart disease Mother   . Hypertension Mother   . Diabetes Mother   . Hypertension Father   . Hypertension Sister   . Hypertension Brother    Social History   Tobacco Use  . Smoking status: Never Smoker  . Smokeless tobacco: Never Used  Vaping Use  . Vaping Use: Never used  Substance Use Topics  . Alcohol use: Never  . Drug use: Not Currently   Allergies  Allergen Reactions  . Latex Itching   No medications prior to admission.   I have reviewed patient's Past Medical Hx, Surgical Hx, Family Hx, Social Hx, medications and allergies.   ROS:  Review of Systems  Constitutional: Negative for chills,  fatigue and fever.  HENT: Negative for congestion and sore throat.   Respiratory: Negative for cough and shortness of breath.   Cardiovascular: Positive for chest pain and palpitations.  Gastrointestinal: Negative for abdominal pain, constipation, diarrhea, nausea and vomiting.  Genitourinary: Negative for vaginal bleeding and vaginal discharge.  Neurological: Positive for dizziness, light-headedness and headaches. Negative for syncope.  All other systems reviewed and are negative.  Physical Exam   06/11/20 1202 -- 64 -- 17 138/86 -- --  06/11/20 1147 -- 58 -- -- 137/90 -- --  06/11/20 1131 -- 57 -- -- 132/92Abnormal -- 99 %  06/11/20 1117 -- 61 -- -- 127/92Abnormal -- --  06/11/20 1102 -- 61 -- -- 130/95Abnormal -- --  06/11/20 1047 -- 62 -- -- 131/86 -- --  06/11/20 1031 -- 57 -- -- 129/91Abnormal -- 99 %  06/11/20 1016 -- 72 -- -- 122/87 -- 100 %  06/11/20 1002 -- 62 -- -- 121/85 -- --  06/11/20 0946 -- 62 -- -- 123/86 -- 100 %  06/11/20 0931 -- 63 -- -- 127/82 -- 100 %  06/11/20 0916 -- 74 -- -- 111/96Abnormal -- 98 %  06/11/20 0910 -- 70 -- -- 123/83 -- 100 %  06/11/20 0850 97.9 F (36.6 C) 70 -- 20 132/90     Constitutional: Well-developed, well-nourished  female in no acute distress.  Cardiovascular: normal rate & rhythm, no murmur Respiratory: normal effort, lung sounds clear throughout GI: Abd soft, non-tender, gravid appropriate for gestational age. Pos BS x 4 MS: Extremities nontender, no edema, normal ROM Neurologic: Alert and oriented x 4.  GU: no CVA tenderness Pelvic exam deferred   Labs: EKG normal UA: Color, Urine YELLOW YELLOW   APPearance CLEAR HAZYAbnormal   Specific Gravity, Urine 1.005 - 1.030 1.019   pH 5.0 - 8.0 5.0   Glucose, UA NEGATIVE mg/dL NEGATIVE   Hgb urine dipstick NEGATIVE NEGATIVE   Bilirubin Urine NEGATIVE NEGATIVE   Ketones, ur NEGATIVE mg/dL NEGATIVE   Protein, ur NEGATIVE mg/dL NEGATIVE   Nitrite NEGATIVE NEGATIVE    Leukocytes,Ua NEGATIVE NEGATIVE    CBC WBC 4.0 - 10.5 K/uL 3.8Low   RBC 3.87 - 5.11 MIL/uL 4.61   Hemoglobin 12.0 - 15.0 g/dL 13.5   HCT 36.0 - 46.0 % 42.8   MCV 80.0 - 100.0 fL 92.8   MCH 26.0 - 34.0 pg 29.3   MCHC 30.0 - 36.0 g/dL 31.5   RDW 11.5 - 15.5 % 13.4   Platelets 150 - 400 K/uL 329   nRBC 0.0 - 0.2 % 0.0    CMP Sodium 135 - 145 mmol/L 138   Potassium 3.5 - 5.1 mmol/L 4.5   Chloride 98 - 111 mmol/L 101   CO2 22 - 32 mmol/L 27   Glucose, Bld 70 - 99 mg/dL 85   BUN 6 - 20 mg/dL 12   Creatinine, Ser 0.44 - 1.00 mg/dL 0.88   Calcium 8.9 - 10.3 mg/dL 9.2   Total Protein 6.5 - 8.1 g/dL 6.9   Albumin 3.5 - 5.0 g/dL 3.7   AST 15 - 41 U/L 18   ALT 0 - 44 U/L 27   Alkaline Phosphatase 38 - 126 U/L 69   Total Bilirubin 0.3 - 1.2 mg/dL 0.8   GFR, Estimated >60 mL/min >60   Anion gap 5 - 15 10    Troponin <2 BNP 11.9  Imaging:  CLINICAL DATA:  Central chest pain and productive cough, history of COVID-19 positivity  EXAM: PORTABLE CHEST 1 VIEW  COMPARISON:  None.  FINDINGS: Cardiac shadow is within normal limits. The lungs are well aerated bilaterally. No focal confluent infiltrate is seen. No bony abnormality is noted.  IMPRESSION: No acute abnormality seen.   Electronically Signed   By: Inez Catalina M.D.   On: 06/11/2020 10:36  MAU Course: Orders Placed This Encounter  Procedures  . DG Chest Portable 1 View  . Urinalysis, Routine w reflex microscopic  . CBC  . Comprehensive metabolic panel  . Brain natriuretic peptide  . ED EKG  . Discharge patient   Meds ordered this encounter  Medications  . acetaminophen (TYLENOL) tablet 1,000 mg   MDM: All lab results and testing normal  Tylenol relieved headache, no further episodes of transient chest pain  Discussed at length with patient how Covid-19 could be affecting her postpartum recovery and recommended increased rest, nutrition, hydration, etc. FOB present for discussion and verbalized  additional support  Assessment: 1. Exhaustion   2. Dizziness   3. Lightheadedness   4. COVID-19    Plan: Discharge home in stable condition with return precautions.     Follow-up Information    Gynecology, [facility]. Go to.   Specialty: Obstetrics and Gynecology Why: as scheduled for postpartum care Contact information: [address]  [address] [phone]               Allergies as of 06/11/2020      Reactions   Latex Itching      Medication List    TAKE these medications   ibuprofen 800 MG tablet Commonly known as: ADVIL Take 1 tablet (800 mg total) by mouth every 8 (eight) hours as needed.   NIFEdipine 30 MG 24 hr tablet Commonly known as: ADALAT CC Take 1 tablet (30 mg total) by mouth daily.   prenatal multivitamin Tabs tablet Take 1 tablet by mouth daily at 12 noon.      Gaylan Gerold, CNM, MSN, [place] Certified Nurse Midwife, [facility]

## 2020-06-11 NOTE — MAU Note (Signed)
Presents stating during a virtual visit she informed nurse she had been experiencing dizziness, H/A, light headedness, and chest pain, was instructed to be seen in MAU for eval.  Currently has dizziness, H/A, and light headedness.  Took Motrin for H/A @ 2330 last night. S/P Cesarean 05/13/2020.

## 2020-06-11 NOTE — Discharge Instructions (Signed)
[facility] Doulas: [phone] [email or web address]

## 2021-04-18 IMAGING — MR MR ABDOMEN W/O CM
12 of 14 series · 36 of 48 positions shown · non-contrast
Comparison: None.

CLINICAL DATA: Right lower quadrant pain. Suspected appendicitis.
Twenty-two weeks pregnant.

EXAM:
MRI ABDOMEN AND PELVIS WITHOUT CONTRAST
TECHNIQUE: Multiplanar multisequence MR imaging of the abdomen and pelvis was
performed. No intravenous contrast was administered.

[Series 7: cor haste · coronal · 5.0mm · 1.17mm/px · 4 of 46 slices shown]
[im 1/46]
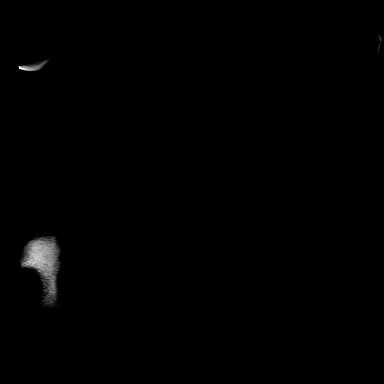
[im 16/46]
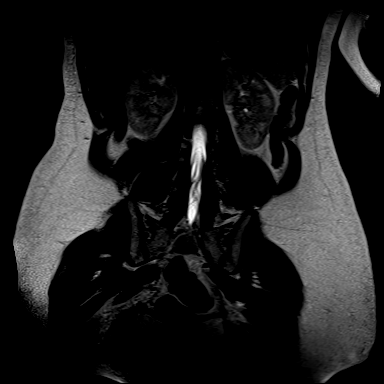
[im 31/46]
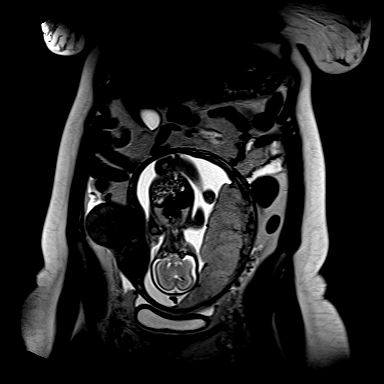
[im 46/46]
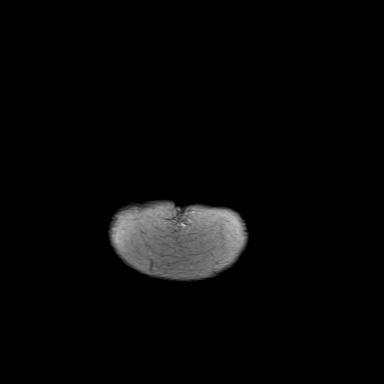

[Series 10: cor haste fs · coronal · 5.0mm · 1.04mm/px · 4 of 46 slices shown]
[im 1/46]
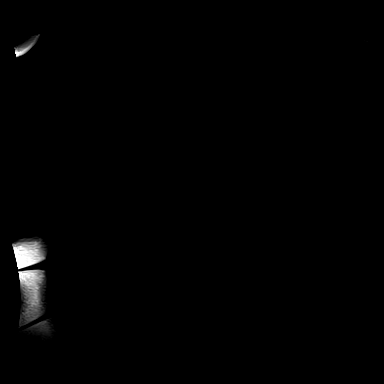
[im 16/46]
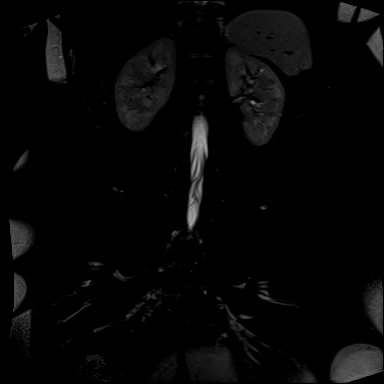
[im 31/46]
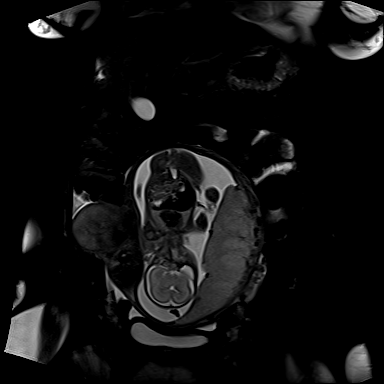
[im 46/46]
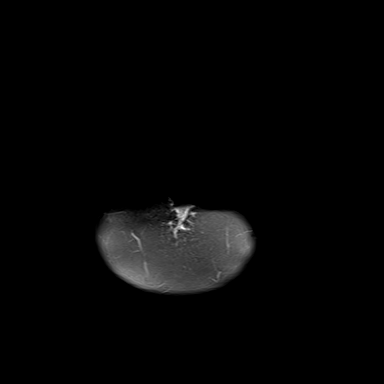

[Series 11: bSSFP · coronal · 5.0mm · 2.01mm/px · 4 of 46 slices shown (1 of 3)]
[im 1/46]
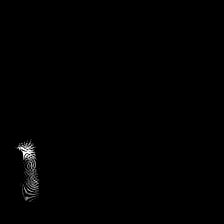
[im 16/46]
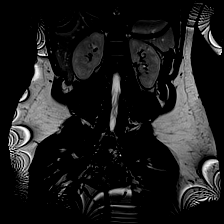
[im 31/46]
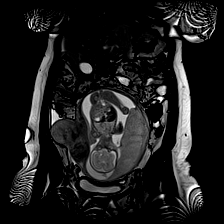
[im 46/46]
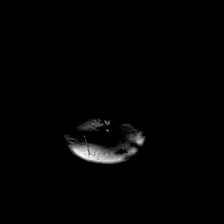

[Series 16: ax haste fs_comp · axial · 5.0mm · 1.41mm/px · z∈[-239,-23]mm · 3 of 37 slices shown (1 of 2)]
[im 1/37]
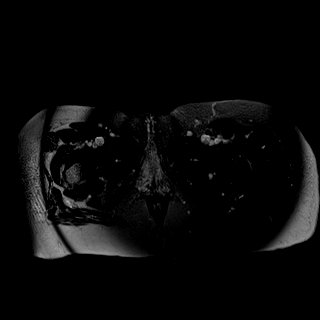
[im 19/37]
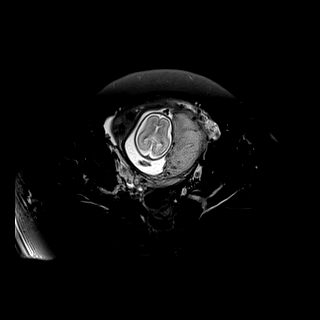
[im 37/37]
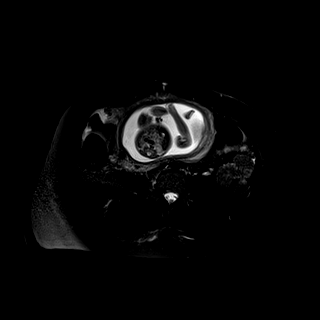

[Series 16: ax haste fs_comp · axial · 5.0mm · 1.17mm/px · z∈[-17,+199]mm · 2 of 37 slices shown (2 of 2)]
[im 1/37]
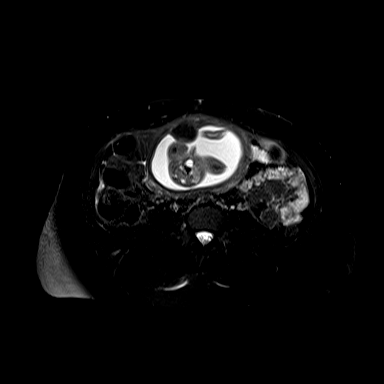
[im 37/37]
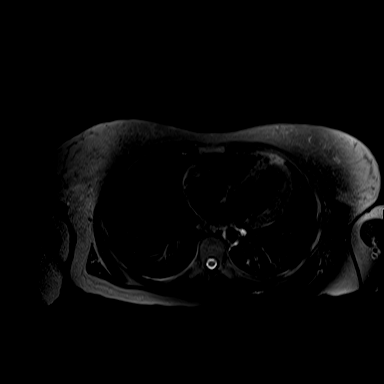

[Series 21: ax haste_comp · axial · 5.0mm · 1.41mm/px · z∈[-239,-23]mm · 2 of 37 slices shown (1 of 2)]
[im 1/37]
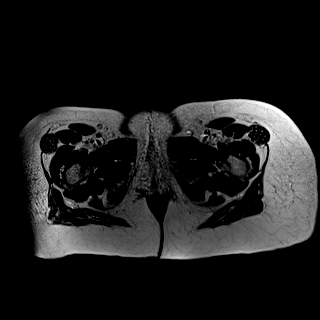
[im 37/37]
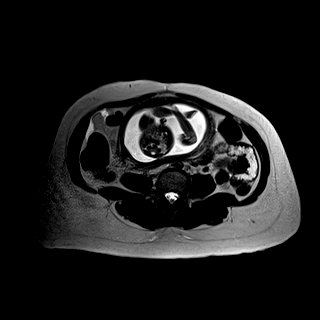

[Series 21: ax haste_comp · axial · 5.0mm · 1.17mm/px · z∈[-17,+199]mm · 2 of 37 slices shown (2 of 2)]
[im 1/37]
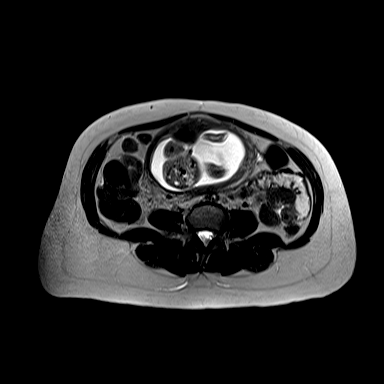
[im 37/37]
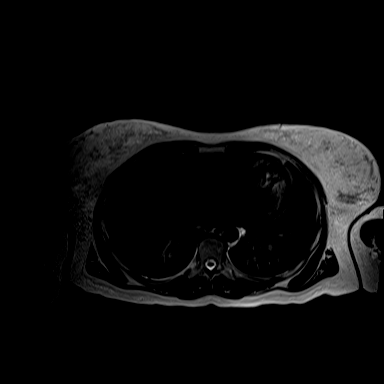

[Series 28: bSSFP · axial · 5.0mm · 2.01mm/px · z∈[-21,+194]mm · 2 of 37 slices shown (2 of 3)]
[im 1/37]
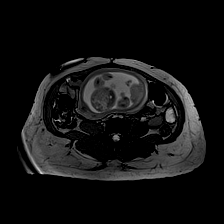
[im 37/37]
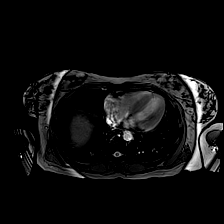

[Series 28: bSSFP · axial · 5.0mm · 0.88mm/px · z∈[-243,-27]mm · 2 of 37 slices shown (3 of 3)]
[im 1/37]
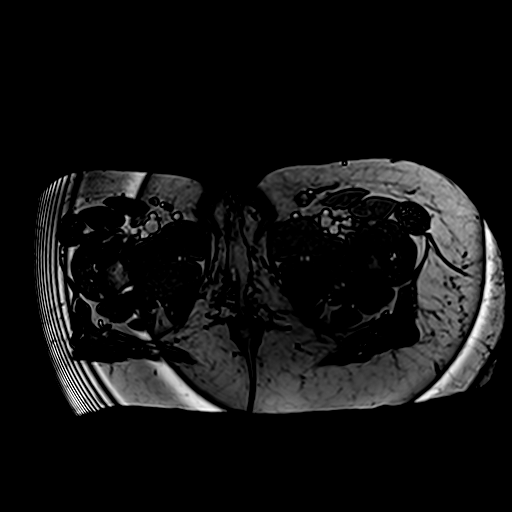
[im 37/37]
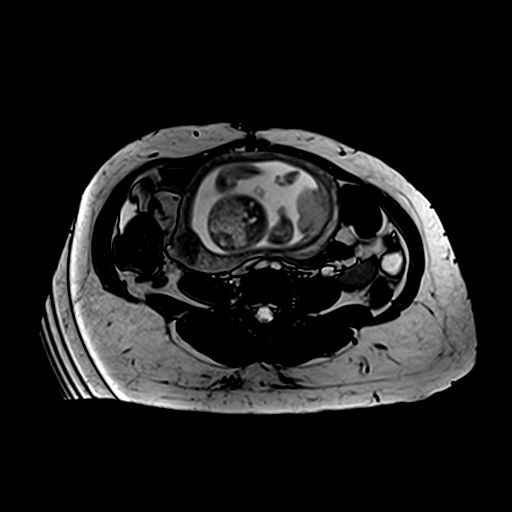

[Series 31: T2 fat-sat · axial · 5.0mm · 1.41mm/px · z∈[-249,+200]mm · 5 of 76 slices shown]
[im 1/76]
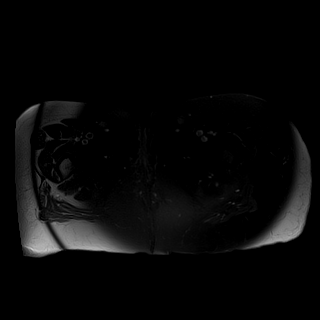
[im 19/76]
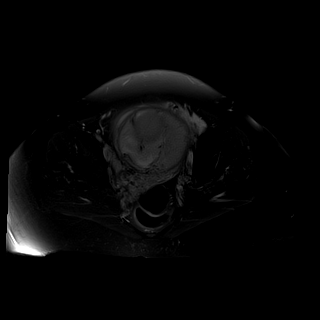
[im 38/76]
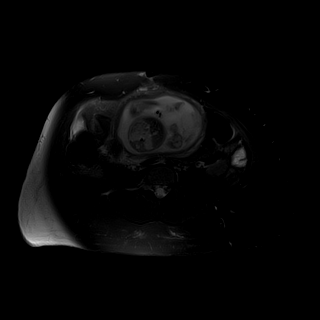
[im 57/76]
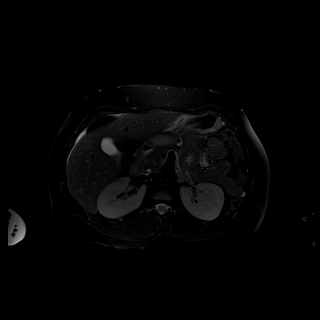
[im 76/76]
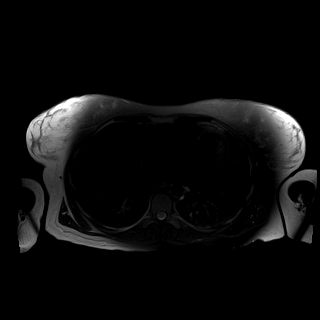

[Series 34: T1 · axial · 3.0mm · 0.88mm/px · z∈[-230,-25]mm · 4 of 58 slices shown (1 of 2)]
[im 1/58]
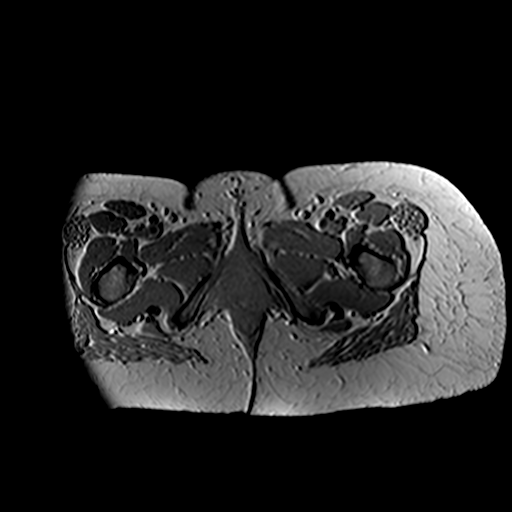
[im 20/58]
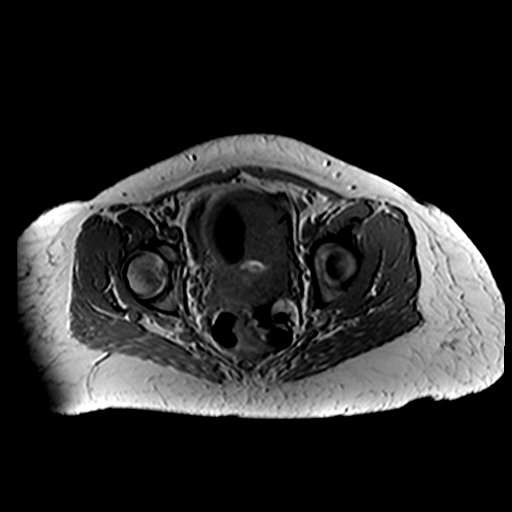
[im 39/58]
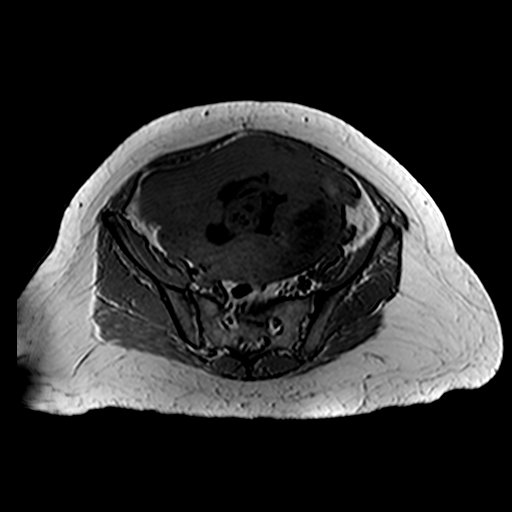
[im 58/58]
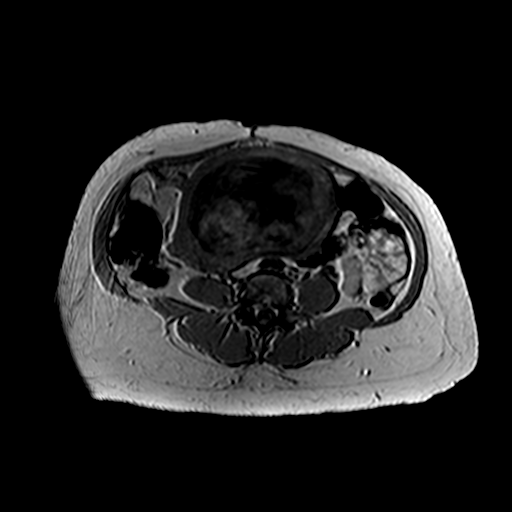

[Series 34: T1 · axial · 3.0mm · 1.76mm/px · z∈[-14,+54]mm · 2 of 58 slices shown (2 of 2)]
[im 1/58]
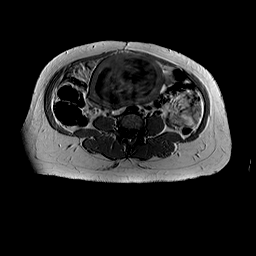
[im 20/58]
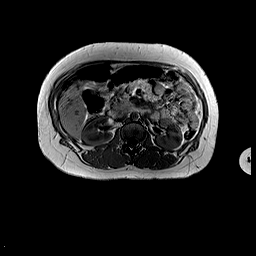

[36 of 48 positions shown; findings below may reference images not displayed]

FINDINGS: COMBINED FINDINGS FOR BOTH MR ABDOMEN AND PELVIS

Lower chest: No acute findings.

Hepatobiliary: A 7 mm lesion is seen in the lateral aspect of the
right hepatic lobe which shows marked T2 hyperintensity, and may
represent a tiny cyst or hemangioma. No suspicious liver masses are
visualized on this unenhanced exam. Gallbladder is unremarkable. No
evidence of biliary ductal dilatation.

Pancreas: No mass or inflammatory process visualized on this
unenhanced exam.

Spleen:  Within normal limits in size.

Adrenals/Urinary tract: A few tiny sub-cm renal cysts are noted
bilaterally. No evidence of hydronephrosis. Insert bladder

Stomach/Bowel: Normal appendix visualized. No evidence of bowel
obstruction or inflammatory process.

Vascular/Lymphatic: No pathologically enlarged lymph nodes
identified. No evidence of abdominal aortic aneurysm.

Reproductive: A single intrauterine fetus is seen in cephalic
presentation. Several uterine fibroids are seen in the anterior,
right lateral, and posterior uterine walls. Largest fibroid is
subcapsular in location protruding into the right adnexal region,
which measures 7.7 x 6.2 cm. Both ovaries are normal appearance.
Small amount of free fluid is seen in the right abdomen and pelvis.

Other:  None.

Musculoskeletal:  No suspicious bone lesions identified.
IMPRESSION: Single intrauterine fetus in cephalic presentation.

No evidence of appendicitis.

Multiple uterine fibroids, with largest measuring 7.7 cm and
protruding into the right adnexal region.

Small amount of free fluid in the right abdomen and pelvis.
